# Patient Record
Sex: Male | Born: 1955 | Race: White | Hispanic: No | Marital: Married | Smoking: Current every day smoker
Health system: Southern US, Community
[De-identification: ages and names within clinical notes are randomized; demographics above are authoritative.]

## PROBLEM LIST (undated history)

## (undated) DIAGNOSIS — C61 Malignant neoplasm of prostate: Secondary | ICD-10-CM

## (undated) DIAGNOSIS — F1020 Alcohol dependence, uncomplicated: Secondary | ICD-10-CM

## (undated) HISTORY — PX: HEMORRHOID SURGERY: SHX153

## (undated) HISTORY — DX: Alcohol dependence, uncomplicated: F10.20

## (undated) HISTORY — DX: Malignant neoplasm of prostate: C61

---

## 2020-03-13 ENCOUNTER — Telehealth: Payer: Self-pay

## 2020-03-13 NOTE — Telephone Encounter (Signed)
Mrs Chait advised and scheduled on 03/18/20. Spoke with Mrs Littman for a while she mentions at that time that when patient has these episodes he gets flushed in his face and his b/p gets elevated between 150ish to 170ish on the top number. Patient would not let wife call 911 last night because he was starting to feel better. Patient was sleeping during our call. I advised Mrs Stebner that patient really needs to be seen at Urgent Care or ER for these symptoms and it was not safe to wait (discussed at length-per wife patient gets stubborn per wife "typical man") She will keep an eye on him today and if he starts having another episode again  and his b/p becomes elevated again she will call 911. She is aware not to wait till Monday if issues develop again. Appointment kept for re establishing care on Monday.  Patient is self pay. FYI to Dr Silvio Pate about above

## 2020-03-13 NOTE — Telephone Encounter (Signed)
I agree with the emergency recommendations. No way to tell if something serious might be going on at this point He should be Medicare eligible at this point ---hopefully that is being set up

## 2020-03-13 NOTE — Telephone Encounter (Signed)
Miguel Christian (patinet's wife who sees Dr Silvio Pate and has been seen him for years) called asking if Dr Silvio Pate would see patient. He use to see Dr Silvio Pate long time ago ( it was so long that there is nothing in Epic) and has not been seen by any other PCP. The reason is patient has been having aggitation/anxiety issues off and on, had episode last night, gets aggitated, paces, feels like he can not catch his breath and has to take deep breaths to calm himself. They have had some family loose and some changes in their life that could have been triggering this per wife.  I advised Miguel Christian that we are not taking any new patients right now and she said that she has seen Dr Silvio Pate for a long time and can not imagine that he would not see her husband. I advised that I would send a message to get Dr Alla German feedback and the nurse would call her back to let them know his recommendations. CB: [phone] and Cell # [phone]. Thank you

## 2020-03-13 NOTE — Telephone Encounter (Signed)
Okay to set up appt for him---hopefully you can find a spot by next week or so. I really can't make any recommendations till seeing him

## 2020-03-15 ENCOUNTER — Other Ambulatory Visit: Payer: Self-pay

## 2020-03-18 ENCOUNTER — Encounter: Payer: Self-pay | Admitting: Internal Medicine

## 2020-03-18 ENCOUNTER — Ambulatory Visit (INDEPENDENT_AMBULATORY_CARE_PROVIDER_SITE_OTHER): Payer: Medicare Other | Admitting: Internal Medicine

## 2020-03-18 ENCOUNTER — Other Ambulatory Visit: Payer: Self-pay

## 2020-03-18 VITALS — BP 124/80 | HR 82 | Temp 98.0°F | Ht 70.5 in | Wt 216.0 lb

## 2020-03-18 DIAGNOSIS — F1020 Alcohol dependence, uncomplicated: Secondary | ICD-10-CM | POA: Diagnosis not present

## 2020-03-18 DIAGNOSIS — R002 Palpitations: Secondary | ICD-10-CM | POA: Diagnosis not present

## 2020-03-18 DIAGNOSIS — Z23 Encounter for immunization: Secondary | ICD-10-CM | POA: Diagnosis not present

## 2020-03-18 NOTE — Addendum Note (Signed)
Addended by: Pilar Grammes on: 03/18/2020 05:27 PM   Modules accepted: Orders

## 2020-03-18 NOTE — Progress Notes (Signed)
Subjective:    Patient ID: Miguel Christian, male    DOB: [birth year], 65 y.o.   MRN: [id number]  HPI Here with wife to reestablish care---hasn't been seen in quite some time (not seeing any doctors) Concern about agitation and BP This visit occurred during the SARS-CoV-2 public health emergency.  Safety protocols were in place, including screening questions prior to the visit, additional usage of staff PPE, and extensive cleaning of exam room while observing appropriate contact time as indicated for disinfecting solutions.   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 Some SOB---but it passed fairly quickly Then this recurred about 5 weeks ago Had to pace around--had to take deep breaths and was antsy  Then wife went to [place] 3 weeks ago to see family He started getting the feeling again Felt flushed in the face and could see he was red (and could feel heart beating in his face) Resolved over about an hour Then it came back for a while Better the next morning  He has "nervous energy" Has to pace around the house No recurrence of facial flushing but can feel his heart beating in his throat  When he is out doing stuff---like out in the yard cutting wood--he does fine Just sitting around, he will get the heart beating feeling again Did do COVID test--just in case----negative Cousin checked BP at times. BP as high as 169/91----but also 133/86  No chest pain No arm pain No nausea  Did call ambulance EKG was normal Vitals were also fine--so they didn't transport No history of anxiety  Drinks at least 10-15 beers a day---and gets shakes at times (in mornings) Does drink in the day if he is not working Ongoing smoking  No current outpatient medications on file prior to visit.   No current facility-administered medications on file prior to visit.    No Known Allergies  Past Medical History:  Diagnosis Date  . Alcohol use disorder,  moderate, dependence ([place])     Past Surgical History:  Procedure Laterality Date  . HEMORRHOID SURGERY  ~2000    Family History  Problem Relation Age of Onset  . Alcohol abuse Father   . Alcohol abuse Brother   . Atrial fibrillation Brother   . Colon cancer Maternal Uncle        2 of them  . Hearing loss Maternal Uncle        2 of uncles  . Diabetes Brother   . Hypertension Brother     Social History   Socioeconomic History  . Marital status: Married    Spouse name: Not on file  . Number of children: Not on file  . Years of education: Not on file  . Highest education level: Not on file  Occupational History  . Occupation: Engineer, drilling    Comment: Retired  . Occupation: SIde work    Comment: Scientist, water quality work, Radiographer, therapeutic  . Smoking status: Current Every Day Smoker  . Smokeless tobacco: Never Used  Substance and Sexual Activity  . Alcohol use: Not on file  . Drug use: Not on file  . Sexual activity: Not on file  Other Topics Concern  . Not on file  Social History Narrative   2 step sons---- 1 died 24-May-2010 and they other is estranged   Raised 2 granddaughters   Social Determinants of Radio broadcast assistant Strain: Not on file  Food Insecurity: Not on file  Transportation Needs:  Not on file  Physical Activity: Not on file  Stress: Not on file  Social Connections: Not on file  Intimate Partner Violence: Not on file   Review of Systems  Constitutional: Negative for fatigue and unexpected weight change.  Respiratory: Positive for shortness of breath. Negative for cough and chest tightness.   Cardiovascular: Positive for palpitations. Negative for chest pain and leg swelling.  Gastrointestinal: Negative for blood in stool and constipation.       No sig heartburn  Genitourinary: Negative for difficulty urinating and urgency.       Nocturia x 3-4  Musculoskeletal: Positive for arthralgias and myalgias.       Takes ibuprofen 600mg  once (and sometimes  twice a day)  Neurological: Negative for dizziness, syncope, light-headedness and headaches.  Psychiatric/Behavioral: Negative for dysphoric mood and sleep disturbance. The patient is not nervous/anxious.        Objective:   Physical Exam Constitutional:      Appearance: Normal appearance.  HENT:     Mouth/Throat:     Comments: No oral lesions Cardiovascular:     Rate and Rhythm: Normal rate and regular rhythm.     Heart sounds: No murmur heard. No gallop.      Comments: Normal pulse on right, very faint on left Pulmonary:     Effort: Pulmonary effort is normal.     Breath sounds: Normal breath sounds. No wheezing or rales.  Abdominal:     Palpations: Abdomen is soft.     Tenderness: There is no abdominal tenderness.  Musculoskeletal:     Cervical back: Neck supple.     Right lower leg: No edema.     Left lower leg: No edema.  Lymphadenopathy:     Cervical: No cervical adenopathy.  Neurological:     General: No focal deficit present.     Mental Status: He is alert and oriented to person, place, and time.  Psychiatric:        Mood and Affect: Mood normal.        Behavior: Behavior normal.            Assessment & Plan:

## 2020-03-18 NOTE — Assessment & Plan Note (Signed)
Only beer but enough he sometimes has shakes the next morning (though doesn't drink all day if working and doesn't worsen) Discussed that he needs to at least cut back (suggested trial of just 3-4 beers a day for now)

## 2020-03-18 NOTE — Assessment & Plan Note (Signed)
Sensation is not of racing--but aware of his heart in spells Not with exertion No history of anxiety Discussed the alcohol can be a factor Must stop smoking Will check labs Cardiology evaluation

## 2020-03-19 LAB — COMPREHENSIVE METABOLIC PANEL
ALT: 28 U/L (ref 0–53)
AST: 21 U/L (ref 0–37)
Albumin: 4.3 g/dL (ref 3.5–5.2)
Alkaline Phosphatase: 61 U/L (ref 39–117)
BUN: 9 mg/dL (ref 6–23)
CO2: 29 mEq/L (ref 19–32)
Calcium: 9.8 mg/dL (ref 8.4–10.5)
Chloride: 101 mEq/L (ref 96–112)
Creatinine, Ser: 0.93 mg/dL (ref 0.40–1.50)
GFR: 86.21 mL/min (ref >60.00)
Glucose, Bld: 88 mg/dL (ref 70–99)
Potassium: 4.6 mEq/L (ref 3.5–5.1)
Sodium: 137 mEq/L (ref 135–145)
Total Bilirubin: 0.6 mg/dL (ref 0.2–1.2)
Total Protein: 6.8 g/dL (ref 6.0–8.3)

## 2020-03-19 LAB — CBC
HCT: 44.3 % (ref 39.0–52.0)
Hemoglobin: 15.4 g/dL (ref 13.0–17.0)
MCHC: 34.9 g/dL (ref 30.0–36.0)
MCV: 99.8 fl (ref 78.0–100.0)
Platelets: 232 10*3/uL (ref 150.0–400.0)
RBC: 4.43 Mil/uL (ref 4.22–5.81)
RDW: 13.3 % (ref 11.5–15.5)
WBC: 9.4 10*3/uL (ref 4.0–10.5)

## 2020-03-19 LAB — VITAMIN B12: Vitamin B-12: 141 pg/mL — ABNORMAL LOW (ref 211–911)

## 2020-03-19 LAB — T4, FREE: Free T4: 0.94 ng/dL (ref 0.60–1.60)

## 2020-03-19 LAB — SEDIMENTATION RATE: Sed Rate: 7 mm/hr (ref 0–20)

## 2020-03-22 ENCOUNTER — Ambulatory Visit (INDEPENDENT_AMBULATORY_CARE_PROVIDER_SITE_OTHER): Payer: Medicare Other

## 2020-03-22 ENCOUNTER — Other Ambulatory Visit: Payer: Self-pay

## 2020-03-22 ENCOUNTER — Ambulatory Visit (INDEPENDENT_AMBULATORY_CARE_PROVIDER_SITE_OTHER): Payer: Medicare Other | Admitting: Cardiology

## 2020-03-22 ENCOUNTER — Encounter: Payer: Self-pay | Admitting: Cardiology

## 2020-03-22 VITALS — BP 138/82 | HR 74 | Ht 70.5 in | Wt 220.0 lb

## 2020-03-22 DIAGNOSIS — F1020 Alcohol dependence, uncomplicated: Secondary | ICD-10-CM

## 2020-03-22 DIAGNOSIS — R002 Palpitations: Secondary | ICD-10-CM | POA: Diagnosis not present

## 2020-03-22 DIAGNOSIS — R0602 Shortness of breath: Secondary | ICD-10-CM | POA: Diagnosis not present

## 2020-03-22 NOTE — Patient Instructions (Signed)
Medication Instructions:  Your physician recommends that you continue on your current medications as directed. Please refer to the Current Medication list given to you today.  *If you need a refill on your cardiac medications before your next appointment, please call your pharmacy*   Lab Work: None ordered If you have labs (blood work) drawn today and your tests are completely normal, you will receive your results only by: Marland Kitchen MyChart Message (if you have MyChart) OR . A paper copy in the mail If you have any lab test that is abnormal or we need to change your treatment, we will call you to review the results.   Testing/Procedures: Your physician has requested that you have an echocardiogram. Echocardiography is a painless test that uses sound waves to create images of your heart. It provides your doctor with information about the size and shape of your heart and how well your heart's chambers and valves are working. This procedure takes approximately one hour. There are no restrictions for this procedure.  Your physician has recommended that you wear a Zio monitor.(To be worn for 14 days) This monitor is a medical device that records the heart's electrical activity. Doctors most often use these monitors to diagnose arrhythmias. Arrhythmias are problems with the speed or rhythm of the heartbeat. The monitor is a small device applied to your chest. You can wear one while you do your normal daily activities. While wearing this monitor if you have any symptoms to push the button and record what you felt. Once you have worn this monitor for the period of time provider prescribed (Usually 14 days), you will return the monitor device in the postage paid box. Once it is returned they will download the data collected and provide [place] with a report which the provider will then review and we will call you with those results. Important tips:  1. Avoid showering during the first 24 hours of wearing the  monitor. 2. Avoid excessive sweating to help maximize wear time. 3. Do not submerge the device, no hot tubs, and no swimming pools. 4. Keep any lotions or oils away from the patch. 5. After 24 hours you may shower with the patch on. Take brief showers with your back facing the shower head.  6. Do not remove patch once it has been placed because that will interrupt data and decrease adhesive wear time. 7. Push the button when you have any symptoms and write down what you were feeling. 8. Once you have completed wearing your monitor, remove and place into box which has postage paid and place in your outgoing mailbox.  9. If for some reason you have misplaced your box then call our office and we can provide another box and/or mail it off for you.         Follow-Up: At [facility], you and your health needs are our priority.  As part of our continuing mission to provide you with exceptional heart care, we have created designated Provider Care Teams.  These Care Teams include your primary Cardiologist (physician) and Advanced Practice Providers (APPs -  Physician Assistants and Nurse Practitioners) who all work together to provide you with the care you need, when you need it.  We recommend signing up for the patient portal called "MyChart".  Sign up information is provided on this After Visit Summary.  MyChart is used to connect with patients for Virtual Visits (Telemedicine).  Patients are able to view lab/test results, encounter notes, upcoming appointments, etc.  Non-urgent messages can be sent to your provider as well.   To learn more about what you can do with MyChart, go to [email or web address].    Your next appointment:   8 week(s)  The format for your next appointment:   In Person  Provider:   Lars Mage, MD   Other Instructions N/A

## 2020-03-22 NOTE — Progress Notes (Signed)
Electrophysiology Office Note:    Date:  03/22/2020   ID:  Miguel Christian, DOB Nov 07, 1955, MRN [id number]  PCP:  Venia Carbon, MD  [place] Cardiologist:  No primary care provider on file.  [facility] Electrophysiologist:  Vickie Epley, MD   Referring MD: Venia Carbon, MD   Chief Complaint: Palpitations  History of Present Illness:    Miguel Christian is a 65 y.o. male who presents for an evaluation of palpitations at the request of Dr. Silvio Pate. Their medical history includes alcohol abuse.  He drinks 10-15 beers per day.  He last saw Dr. Silvio Pate March 18, 2020.  At that appointment he reported feeling palpitations and feeling his heart race in his throat.  He is also felt jittery.  Today the patient is with his wife.  He tells me he has stopped smoking since beginning of 2022/05/05.  He is using a patch.  He describes the episodes to me during today's appointment.  He says that he would feel an abnormally prominent heartbeat in his neck and face.  Sometimes accompanied by a warmth in his face.  He would have the urge to take a deep breath during these episodes.  He does not describe a rapid heartbeat during most of the episodes.  He also tells me that sometimes recently he has been laying down at night and feels an irregular rhythm in his ear.  He has a strong family history of atrial fibrillation in his brother who has had 2 ablations and then other family members as well.  He is cut back on his drinking since he met with his primary care physician, Dr. Silvio Pate, recently.  He previously drank about 15 beers a day but is now drinking significantly less.  He tells me he is not quite down to the 2-3 beers per day recommended by Dr. Silvio Pate.  No syncope or presyncope.  No chest pain.  He does tell me that sometimes when he is working outside chopping wood the tips of his fingers will blanch and get very cold.  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  Past Medical History:   Diagnosis Date  . Alcohol use disorder, moderate, dependence ([place])     Past Surgical History:  Procedure Laterality Date  . HEMORRHOID SURGERY  ~2000    Current Medications: No outpatient medications have been marked as taking for the 03/22/20 encounter (Office Visit) with Vickie Epley, MD.     Allergies:   Patient has no known allergies.   Social History   Socioeconomic History  . Marital status: Married    Spouse name: Not on file  . Number of children: Not on file  . Years of education: Not on file  . Highest education level: Not on file  Occupational History  . Occupation: Engineer, drilling    Comment: Retired  . Occupation: SIde work    Comment: Scientist, water quality work, Radiographer, therapeutic  . Smoking status: Former Smoker    Quit date: 03/19/2020  . Smokeless tobacco: Never Used  . Tobacco comment: Using the nicotine patch  Substance and Sexual Activity  . Alcohol use: Not on file  . Drug use: Not on file  . Sexual activity: Not on file  Other Topics Concern  . Not on file  Social History Narrative   2 step sons---- 1 died 05/05/10 and they other is estranged   Raised 2 granddaughters   Social Determinants of Health   Financial Resource Strain:  Not on file  Food Insecurity: Not on file  Transportation Needs: Not on file  Physical Activity: Not on file  Stress: Not on file  Social Connections: Not on file     Family History: The patient's family history includes Alcohol abuse in his brother and father; Atrial fibrillation in his brother; Colon cancer in his maternal uncle; Diabetes in his brother; Hearing loss in his maternal uncle; Hypertension in his brother.  ROS:   Please see the history of present illness.    All other systems reviewed and are negative.  EKGs/Labs/Other Studies Reviewed:    The following studies were reviewed today:   EKG:  The ekg ordered today demonstrates sinus rhythm.  No Q waves.  Good precordial transition.  Recent Labs: 03/18/2020:  ALT 28; BUN 9; Creatinine, Ser 0.93; Hemoglobin 15.4; Platelets 232.0; Potassium 4.6; Sodium 137  Recent Lipid Panel No results found for: CHOL, TRIG, HDL, CHOLHDL, VLDL, LDLCALC, LDLDIRECT  Physical Exam:    VS:  BP 138/82   Pulse 74   Ht 5' 10.5" (1.791 m)   Wt 220 lb (99.8 kg)   BMI 31.12 kg/m     Wt Readings from Last 3 Encounters:  03/22/20 220 lb (99.8 kg)  03/18/20 216 lb (98 kg)     GEN:  Well nourished, well developed in no acute distress HEENT: Normal NECK: No JVD; No carotid bruits LYMPHATICS: No lymphadenopathy CARDIAC: RRR, no murmurs, rubs, gallops RESPIRATORY:  Clear to auscultation without rales, wheezing or rhonchi  ABDOMEN: Soft, non-tender, non-distended MUSCULOSKELETAL:  No edema; No deformity  SKIN: Warm and dry NEUROLOGIC:  Alert and oriented x 3 PSYCHIATRIC:  Normal affect   ASSESSMENT:    1. Palpitations   2. Alcohol use disorder, moderate, dependence ([place])   3. SOB (shortness of breath)    PLAN:    In order of problems listed above:  1. Palpitations Patient has some strong heartbeats that he feels in his neck and face accompanied by mild dyspnea.  Unclear what the etiology would be.  Possibly paroxysms of atrial fibrillation versus PVCs.  Also could be noncardiac.  We will start with a 2-week ZIO monitor and plan to see him back in clinic in 8 weeks.   2.  Alcohol abuse He is cutting back.  Working with Dr. Silvio Pate on this.   3.  Shortness of breath Associated with his palpitations.  Plan to get an echocardiogram to further assess.  Follow-up 8 weeks.  Medication Adjustments/Labs and Tests Ordered: Current medicines are reviewed at length with the patient today.  Concerns regarding medicines are outlined above.  Orders Placed This Encounter  Procedures  . LONG TERM MONITOR (3-14 DAYS)  . EKG 12-Lead  . ECHOCARDIOGRAM COMPLETE   No orders of the defined types were placed in this encounter.    Signed, Lars Mage, MD, [facility]   03/22/2020 3:32 PM    Electrophysiology [place]

## 2020-04-05 DIAGNOSIS — R002 Palpitations: Secondary | ICD-10-CM | POA: Diagnosis not present

## 2020-04-09 ENCOUNTER — Telehealth: Payer: Self-pay | Admitting: Internal Medicine

## 2020-04-09 ENCOUNTER — Other Ambulatory Visit: Payer: Medicare Other

## 2020-04-09 NOTE — Telephone Encounter (Signed)
Patients wife called to cancel his upcoming appt. They are on the way to [place] cause Karen's brother had a heart attack and passed away. They won't be back till after the 4th from [place] . They just wanted me to let you know. EM

## 2020-04-10 NOTE — Telephone Encounter (Signed)
Noted Please reschedule his follow up for a month or so

## 2020-04-19 ENCOUNTER — Ambulatory Visit: Payer: Medicare Other | Admitting: Internal Medicine

## 2020-04-25 ENCOUNTER — Ambulatory Visit (INDEPENDENT_AMBULATORY_CARE_PROVIDER_SITE_OTHER): Payer: Medicare Other

## 2020-04-25 ENCOUNTER — Other Ambulatory Visit: Payer: Self-pay

## 2020-04-25 DIAGNOSIS — R0602 Shortness of breath: Secondary | ICD-10-CM | POA: Diagnosis not present

## 2020-04-25 LAB — ECHOCARDIOGRAM COMPLETE
AR max vel: 2.64 cm2
AV Area VTI: 2.45 cm2
AV Area mean vel: 2.49 cm2
AV Mean grad: 8 mmHg
AV Peak grad: 15.7 mmHg
Ao pk vel: 1.98 m/s
Area-P 1/2: 2.99 cm2
Calc EF: 56 %
S' Lateral: 4 cm
Single Plane A2C EF: 56.1 %
Single Plane A4C EF: 55.3 %

## 2020-04-29 ENCOUNTER — Other Ambulatory Visit: Payer: Self-pay

## 2020-04-29 ENCOUNTER — Telehealth: Payer: Self-pay

## 2020-04-29 ENCOUNTER — Ambulatory Visit (INDEPENDENT_AMBULATORY_CARE_PROVIDER_SITE_OTHER): Payer: Medicare Other | Admitting: Internal Medicine

## 2020-04-29 ENCOUNTER — Encounter: Payer: Self-pay | Admitting: Internal Medicine

## 2020-04-29 VITALS — BP 118/72 | HR 74 | Temp 97.6°F | Ht 71.0 in | Wt 214.0 lb

## 2020-04-29 DIAGNOSIS — F1020 Alcohol dependence, uncomplicated: Secondary | ICD-10-CM | POA: Diagnosis not present

## 2020-04-29 DIAGNOSIS — I471 Supraventricular tachycardia, unspecified: Secondary | ICD-10-CM | POA: Insufficient documentation

## 2020-04-29 DIAGNOSIS — E538 Deficiency of other specified B group vitamins: Secondary | ICD-10-CM

## 2020-04-29 MED ORDER — DILTIAZEM HCL ER COATED BEADS 180 MG PO CP24: 180.0000 mg | ORAL_CAPSULE | Freq: Every day | ORAL | 3 refills | Status: DC

## 2020-04-29 MED ORDER — DILTIAZEM HCL ER COATED BEADS 120 MG PO TB24: 120.0000 mg | ORAL_TABLET | Freq: Every day | ORAL | 3 refills | Status: DC

## 2020-04-29 NOTE — Assessment & Plan Note (Signed)
He has cut back but not stopped No eye opener or morning symptoms Only beer Counseled about minimizing use

## 2020-04-29 NOTE — Telephone Encounter (Signed)
Santiago Glad (DPR signed) left v/m that saw Dr Silvio Pate this afternoon; pharmacy advised pts wife that Diltiazem 120 mg is not in stock and usually [facility] low dose for diltiazem is 180 mg and the 180 mg is 1/4 of price of the diltiazem 120mg /. Santiago Glad request cb after reviewed by DR Silvio Pate. Sending note to Dr Silvio Pate and Larene Beach CMA. Also wants to know if pt should get covid booster.

## 2020-04-29 NOTE — Telephone Encounter (Signed)
Let them know I sent the 180mg  instead. This is still not a big dose--but have him let me know if he has any dizziness, etc  Yes, he should get the booster

## 2020-04-29 NOTE — Assessment & Plan Note (Signed)
Level was low He feels some better since starting 5000 under his tongue Will recheck

## 2020-04-29 NOTE — Telephone Encounter (Signed)
Spoke to wife, Santiago Glad, per DPR.

## 2020-04-29 NOTE — Progress Notes (Signed)
Subjective:    Patient ID: Miguel Christian, male    DOB: [birth year], 65 y.o.   MRN: [id number]  HPI Here with wife for follow up of palpitations This visit occurred during the SARS-CoV-2 public health emergency.  Safety protocols were in place, including screening questions prior to the visit, additional usage of staff PPE, and extensive cleaning of exam room while observing appropriate contact time as indicated for disinfecting solutions.   Still has palpitations Challenging time---granddaughter tried suicide, brother in law (77) died in [place]--they needed to go there, etc  Has stopped smoking Still drinking--but he has cut back  No chest pain  No SOB  Current Outpatient Medications on File Prior to Visit  Medication Sig Dispense Refill  . Cyanocobalamin (VITAMIN B-12) 5000 MCG SUBL Place under the tongue.     No current facility-administered medications on file prior to visit.    No Known Allergies  Past Medical History:  Diagnosis Date  . Alcohol use disorder, moderate, dependence ([place])     Past Surgical History:  Procedure Laterality Date  . HEMORRHOID SURGERY  ~2000    Family History  Problem Relation Age of Onset  . Alcohol abuse Father   . Alcohol abuse Brother   . Atrial fibrillation Brother   . Colon cancer Maternal Uncle        2 of them  . Hearing loss Maternal Uncle        2 of uncles  . Diabetes Brother   . Hypertension Brother     Social History   Socioeconomic History  . Marital status: Married    Spouse name: Not on file  . Number of children: Not on file  . Years of education: Not on file  . Highest education level: Not on file  Occupational History  . Occupation: Engineer, drilling    Comment: Retired  . Occupation: SIde work    Comment: Scientist, water quality work, Radiographer, therapeutic  . Smoking status: Former Smoker    Quit date: 03/19/2020    Years since quitting: 0.1  . Smokeless tobacco: Never Used  . Tobacco comment: Using the nicotine patch   Substance and Sexual Activity  . Alcohol use: Not on file  . Drug use: Not on file  . Sexual activity: Not on file  Other Topics Concern  . Not on file  Social History Narrative   2 step sons---- 1 died 05-13-10 and they other is estranged   Raised 2 granddaughters   Social Determinants of Radio broadcast assistant Strain: Not on file  Food Insecurity: Not on file  Transportation Needs: Not on file  Physical Activity: Not on file  Stress: Not on file  Social Connections: Not on file  Intimate Partner Violence: Not on file   Review of Systems Close to dizziness at times Awakens with right hand numbness (he thinks) --discussed trying splint Eating is spotty---doesn't want to eat when he feels "antsy"    Objective:   Physical Exam Constitutional:      Appearance: Normal appearance.  Cardiovascular:     Rate and Rhythm: Normal rate and regular rhythm.     Heart sounds: No murmur heard. No gallop.   Pulmonary:     Effort: Pulmonary effort is normal.     Breath sounds: Normal breath sounds. No wheezing or rales.  Musculoskeletal:     Cervical back: Neck supple.     Right lower leg: No edema.     Left lower leg: No edema.  Lymphadenopathy:     Cervical: No cervical adenopathy.  Neurological:     Mental Status: He is alert.            Assessment & Plan:

## 2020-04-29 NOTE — Assessment & Plan Note (Signed)
Zio monitor showed a 5 beat VT and many SVT spells (longest 18 seconds) He still has symptoms Will start diltiazem 120mg  Discussed possible side effects

## 2020-04-30 LAB — VITAMIN B12: Vitamin B-12: >1506 pg/mL — ABNORMAL HIGH (ref 211–911)

## 2020-05-02 ENCOUNTER — Telehealth: Payer: Self-pay

## 2020-05-02 NOTE — Telephone Encounter (Signed)
Outreach made to Pt.  Spoke with his wife.  Pt has started diltiazem.    He will continue over the weekend and send this nurse a mychart message on Monday to advise if any improvement on the new medication.  Wife thanked Marine scientist for call.

## 2020-05-02 NOTE — Telephone Encounter (Signed)
-----   Message from Vickie Epley, MD sent at 05/02/2020  3:15 PM EDT ----- Serita Sheller monitor results show brief salvos (all < 20 seconds) of likely an AT. He had a few during the monitoring period that did not correspond to patient triggered events for symptoms. I think diltiazem is a very reasonable choice. I have cc'ed Sonia Baller here to give him a call to discuss and see if he has any questions. I am scheduled to see him in 2 weeks. Thanks! Lars Mage ----- Message ----- From: Venia Carbon, MD Sent: 04/29/2020   2:39 PM EDT To: Vickie Epley, MD  He never heard officially about his zio monitor. He is still having palpitations. I started low dose diltiazem--please let him and me know if you think something else is better

## 2020-05-22 ENCOUNTER — Encounter: Payer: Self-pay | Admitting: Cardiology

## 2020-05-22 ENCOUNTER — Ambulatory Visit (INDEPENDENT_AMBULATORY_CARE_PROVIDER_SITE_OTHER): Payer: Medicare Other | Admitting: Cardiology

## 2020-05-22 ENCOUNTER — Other Ambulatory Visit: Payer: Self-pay

## 2020-05-22 VITALS — BP 120/60 | HR 63 | Ht 72.0 in | Wt 221.0 lb

## 2020-05-22 DIAGNOSIS — R002 Palpitations: Secondary | ICD-10-CM | POA: Diagnosis not present

## 2020-05-22 MED ORDER — DILTIAZEM HCL ER COATED BEADS 240 MG PO CP24: 240.0000 mg | ORAL_CAPSULE | Freq: Every day | ORAL | 3 refills | Status: AC

## 2020-05-22 NOTE — Patient Instructions (Signed)
Medication Instructions:   Your physician has recommended you make the following change in your medication:   1. INCREASE Diltiazem 240mg  - Take one tablet by mouth daily.   *If you need a refill on your cardiac medications before your next appointment, please call your pharmacy*   Lab Work:  1. None Ordered  If you have labs (blood work) drawn today and your tests are completely normal, you will receive your results only by: Marland Kitchen MyChart Message (if you have MyChart) OR . A paper copy in the mail If you have any lab test that is abnormal or we need to change your treatment, we will call you to review the results.   Testing/Procedures:  1. None Ordered   Follow-Up: At [facility], you and your health needs are our priority.  As part of our continuing mission to provide you with exceptional heart care, we have created designated Provider Care Teams.  These Care Teams include your primary Cardiologist (physician) and Advanced Practice Providers (APPs -  Physician Assistants and Nurse Practitioners) who all work together to provide you with the care you need, when you need it.  We recommend signing up for the patient portal called "MyChart".  Sign up information is provided on this After Visit Summary.  MyChart is used to connect with patients for Virtual Visits (Telemedicine).  Patients are able to view lab/test results, encounter notes, upcoming appointments, etc.  Non-urgent messages can be sent to your provider as well.   To learn more about what you can do with MyChart, go to [email or web address].    Your next appointment:   6 month(s)  The format for your next appointment:   In Person  Provider:   Lars Mage, MD

## 2020-05-22 NOTE — Progress Notes (Signed)
Electrophysiology Office Follow up Visit Note:    Date:  05/22/2020   ID:  Miguel Christian, DOB 1955-08-22, MRN [id number]  PCP:  Venia Carbon, MD  [place] Cardiologist:  No primary care provider on file.  [facility] Electrophysiologist:  Vickie Epley, MD    Interval History:    Miguel Christian is a 65 y.o. male who presents for a follow up visit. They were last seen in clinic March 22, 2020 for palpitations.   A 2-week ZIO monitor was ordered. Since their last appointment, the patient saw Dr. Silvio Pate who started a calcium channel blocker.  He is with his wife in clinic today.   Past Medical History:  Diagnosis Date  . Alcohol use disorder, moderate, dependence ([place])     Past Surgical History:  Procedure Laterality Date  . HEMORRHOID SURGERY  ~2000    Current Medications: Current Meds  Medication Sig  . Cyanocobalamin (VITAMIN B-12) 5000 MCG SUBL Place under the tongue 2 (two) times a week.  . diltiazem (CARDIZEM CD) 180 MG 24 hr capsule Take 1 capsule (180 mg total) by mouth daily.  . Multiple Vitamins-Minerals (MULTIVITAMIN ADULT EXTRA C PO) Take by mouth.     Allergies:   Patient has no known allergies.   Social History   Socioeconomic History  . Marital status: Married    Spouse name: Not on file  . Number of children: Not on file  . Years of education: Not on file  . Highest education level: Not on file  Occupational History  . Occupation: Engineer, drilling    Comment: Retired  . Occupation: SIde work    Comment: Scientist, water quality work, Radiographer, therapeutic  . Smoking status: Former Smoker    Quit date: 03/19/2020    Years since quitting: 0.1  . Smokeless tobacco: Never Used  . Tobacco comment: Using the nicotine patch  Substance and Sexual Activity  . Alcohol use: Not on file  . Drug use: Not on file  . Sexual activity: Not on file  Other Topics Concern  . Not on file  Social History Narrative   2 step sons---- 1 died 05/10/10 and they other is  estranged   Raised 2 granddaughters   Social Determinants of Radio broadcast assistant Strain: Not on file  Food Insecurity: Not on file  Transportation Needs: Not on file  Physical Activity: Not on file  Stress: Not on file  Social Connections: Not on file     Family History: The patient's family history includes Alcohol abuse in his brother and father; Atrial fibrillation in his brother; Colon cancer in his maternal uncle; Diabetes in his brother; Hearing loss in his maternal uncle; Hypertension in his brother.  ROS:   Please see the history of present illness.    All other systems reviewed and are negative.  EKGs/Labs/Other Studies Reviewed:    The following studies were reviewed today:  April 12, 2020 ZIO personally reviewed HR 52-207, average 78 bpm. 36 episodes of SVT, longest lasting 18 seconds at a rate of 110bpm. Fastest episode lasted 13 beats at a rate of 207 bpm. Rare ventricular ectopy.  April 25, 2020 echo personally reviewed Left ventricular function normal, 60% Right ventricular function normal No significant valvular abnormalities  EKG:  The ekg ordered today demonstrates normal sinus rhythm.  Normal EKG.  Recent Labs: 03/18/2020: ALT 28; BUN 9; Creatinine, Ser 0.93; Hemoglobin 15.4; Platelets 232.0; Potassium 4.6; Sodium 137  Recent Lipid Panel No results  found for: CHOL, TRIG, HDL, CHOLHDL, VLDL, LDLCALC, LDLDIRECT  Physical Exam:    VS:  BP 120/60 (BP Location: Left Arm, Patient Position: Sitting, Cuff Size: Normal)   Pulse 63   Ht 6' (1.829 m)   Wt 221 lb (100.2 kg)   SpO2 98%   BMI 29.97 kg/m     Wt Readings from Last 3 Encounters:  05/22/20 221 lb (100.2 kg)  04/29/20 214 lb (97.1 kg)  03/22/20 220 lb (99.8 kg)     GEN:  Well nourished, well developed in no acute distress HEENT: Normal NECK: No JVD; No carotid bruits LYMPHATICS: No lymphadenopathy CARDIAC: RRR, no murmurs, rubs, gallops RESPIRATORY:  Clear to auscultation  without rales, wheezing or rhonchi  ABDOMEN: Soft, non-tender, non-distended MUSCULOSKELETAL:  No edema; No deformity  SKIN: Warm and dry NEUROLOGIC:  Alert and oriented x 3 PSYCHIATRIC:  Normal affect   ASSESSMENT:    1. Palpitations    PLAN:    In order of problems listed above:  1. Palpitations Symptomatic PACs, PVCs.  Doing better on diltiazem 180 mg once daily.  Still having some breakthrough symptoms.  There is certainly an element of anxiety and stress that are contributing. We will plan to increase his Cardizem to 240 mg by mouth once daily.  I will see him back in 6 months to assess response and how he is doing.  Medication Adjustments/Labs and Tests Ordered: Current medicines are reviewed at length with the patient today.  Concerns regarding medicines are outlined above.  No orders of the defined types were placed in this encounter.  No orders of the defined types were placed in this encounter.    Signed, Lars Mage, MD, [facility], [facility]  05/22/2020 2:30 PM    Electrophysiology [facility]

## 2020-08-07 ENCOUNTER — Ambulatory Visit (INDEPENDENT_AMBULATORY_CARE_PROVIDER_SITE_OTHER): Payer: Medicare Other | Admitting: Internal Medicine

## 2020-08-07 ENCOUNTER — Other Ambulatory Visit: Payer: Self-pay

## 2020-08-07 ENCOUNTER — Encounter: Payer: Self-pay | Admitting: Internal Medicine

## 2020-08-07 VITALS — BP 132/82 | HR 67 | Temp 97.6°F | Ht 70.75 in | Wt 225.0 lb

## 2020-08-07 DIAGNOSIS — E538 Deficiency of other specified B group vitamins: Secondary | ICD-10-CM | POA: Diagnosis not present

## 2020-08-07 DIAGNOSIS — Z1211 Encounter for screening for malignant neoplasm of colon: Secondary | ICD-10-CM | POA: Diagnosis not present

## 2020-08-07 DIAGNOSIS — Z0000 Encounter for general adult medical examination without abnormal findings: Secondary | ICD-10-CM | POA: Insufficient documentation

## 2020-08-07 DIAGNOSIS — F1020 Alcohol dependence, uncomplicated: Secondary | ICD-10-CM

## 2020-08-07 DIAGNOSIS — Z87891 Personal history of nicotine dependence: Secondary | ICD-10-CM

## 2020-08-07 DIAGNOSIS — Z7189 Other specified counseling: Secondary | ICD-10-CM

## 2020-08-07 DIAGNOSIS — I471 Supraventricular tachycardia: Secondary | ICD-10-CM | POA: Diagnosis not present

## 2020-08-07 NOTE — Progress Notes (Signed)
Hearing Screening  Method: Audiometry   500Hz  1000Hz  2000Hz  4000Hz   Right ear 20 20 20 20   Left ear 20 20 20 20    Vision Screening   Right eye Left eye Both eyes  Without correction 20/25 20/40 20/25   With correction

## 2020-08-07 NOTE — Assessment & Plan Note (Signed)
See social history 

## 2020-08-07 NOTE — Assessment & Plan Note (Signed)
Is taking the supplements intermittently-=--discussed

## 2020-08-07 NOTE — Assessment & Plan Note (Signed)
I have personally reviewed the Medicare Annual Wellness questionnaire and have noted 1. The patient's medical and social history 2. Their use of alcohol, tobacco or illicit drugs 3. Their current medications and supplements 4. The patient's functional ability including ADL's, fall risks, home safety risks and hearing or visual             impairment. 5. Diet and physical activities 6. Evidence for depression or mood disorders  The patients weight, height, BMI and visual acuity have been recorded in the chart I have made referrals, counseling and provided education to the patient based review of the above and I have provided the pt with a written personalized care plan for preventive services.  I have provided you with a copy of your personalized plan for preventive services. Please take the time to review along with your updated medication list.  Will need pneumovax later COVID booster and flu vaccine Will set up for colonoscopy Discussed PSA--will defer Discussed exercise Td if injury Doesn't want lung cancer screening for now Needs AAA scan

## 2020-08-07 NOTE — Progress Notes (Signed)
Subjective:    Patient ID: Miguel Christian, male    DOB: 1955/12/18, 65 y.o.   MRN: [id number]  HPI Here with wife for Welcome to Medicare visit and follow up of chronic health conditions This visit occurred during the SARS-CoV-2 public health emergency.  Safety protocols were in place, including screening questions prior to the visit, additional usage of staff PPE, and extensive cleaning of exam room while observing appropriate contact time as indicated for disinfecting solutions.   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     diltiazem (CARDIZEM CD) 240 MG 24 hr capsule Take 1 capsule (240 mg total) by mouth daily. 90 capsule 3   Multiple Vitamins-Minerals (MULTIVITAMIN ADULT EXTRA C PO) Take by mouth.     No current facility-administered medications on file prior to visit.    No Known Allergies  Past Medical History:  Diagnosis Date   Alcohol use disorder, moderate, dependence ([facility])     Past Surgical History:  Procedure Laterality Date   HEMORRHOID SURGERY  ~2000    Family History  Problem Relation Age of Onset   Alcohol abuse  Father    Alcohol abuse Brother    Atrial fibrillation Brother    Colon cancer Maternal Uncle        2 of them   Hearing loss Maternal Uncle        2 of uncles   Diabetes Brother    Hypertension Brother     Social History   Socioeconomic History   Marital status: Married    Spouse name: Not on file   Number of children: Not on file   Years of education: Not on file   Highest education level: Not on file  Occupational History   Occupation: Engineer, drilling    Comment: Retired   Occupation: SIde work    Comment: Landscaping  Tobacco Use   Smoking status: Former    Pack years: 0.00    Types: Cigarettes    Quit date: 03/19/2020    Years since quitting: 0.3   Smokeless tobacco: Never   Tobacco comments:    Using the nicotine patch  Substance and Sexual Activity   Alcohol use: Not on file   Drug use: Not on file   Sexual activity: Not on file  Other Topics Concern   Not on file  Social History Narrative   2 step sons---- 1 died May 21, 2010 and they other is estranged   Raised 2 granddaughters      No living will   Wife would be health care POA--granddaughter is alternate   Would accept resuscitation   Not sure about tube feeds   Social  Determinants of Health   Financial Resource Strain: Not on file  Food Insecurity: Not on file  Transportation Needs: Not on file  Physical Activity: Not on file  Stress: Not on file  Social Connections: Not on file  Intimate Partner Violence: Not on file   Review of Systems Appetite is okay--not big Weight stable Sleeps fine Wears seat belt Full dentures Has growth on chin--flakes and tender at times No heartburn or dysphagia No specific back or joint pains. Chronic left foot pain (broke it years ago)    Objective:   Physical Exam Constitutional:      Appearance: Normal appearance.  HENT:     Mouth/Throat:     Comments: No lesions Eyes:     Conjunctiva/sclera: Conjunctivae normal.     Pupils: Pupils are equal, round, and reactive  to light.  Cardiovascular:     Rate and Rhythm: Normal rate and regular rhythm.     Heart sounds: No murmur heard.   No gallop.     Comments: Normal pulse right foot, faint on left Pulmonary:     Effort: Pulmonary effort is normal.     Breath sounds: Normal breath sounds. No wheezing or rales.  Abdominal:     Palpations: Abdomen is soft.     Tenderness: There is no abdominal tenderness.  Musculoskeletal:     Cervical back: Neck supple.     Right lower leg: No edema.     Left lower leg: No edema.  Lymphadenopathy:     Cervical: No cervical adenopathy.  Skin:    Comments: Warty type growth on chin--~20mm. Recommended cryotherapapy 2 cysts on back  Neurological:     Mental Status: He is alert and oriented to person, place, and time.     Comments: President--- "Edmon Crape, Trump, Obama" [id number] Recall 3/3  Psychiatric:        Mood and Affect: Mood normal.        Behavior: Behavior normal.           Assessment & Plan:

## 2020-08-07 NOTE — Assessment & Plan Note (Signed)
Controlled with the diltiazem

## 2020-08-07 NOTE — Assessment & Plan Note (Signed)
Still drinks too much No clear worrisome behaviors Has some tremor--but not a withdrawal thing Counseled to reduce quantitiy

## 2020-08-16 ENCOUNTER — Telehealth: Payer: Self-pay

## 2020-08-16 NOTE — Telephone Encounter (Signed)
Left message for patient to call me back. Patient is scheduled for his Ultrasound ordered by Dr Damita Dunnings on 08/22/20 at 9 am at [facility]. To arrive at 8:30 am, nothing to eat or drink after midnight. Enter through the main [employer].  If need to reschedule please call [phone]-option [phone] then option 2.

## 2020-08-21 NOTE — Telephone Encounter (Signed)
Patient's wife advised

## 2020-08-22 ENCOUNTER — Other Ambulatory Visit: Payer: Self-pay

## 2020-08-22 ENCOUNTER — Ambulatory Visit
Admission: RE | Admit: 2020-08-22 | Discharge: 2020-08-22 | Disposition: A | Discharge status: Home / Self Care | Payer: Medicare Other | Source: Ambulatory Visit | Attending: Internal Medicine | Admitting: Internal Medicine

## 2020-08-22 DIAGNOSIS — Z87891 Personal history of nicotine dependence: Secondary | ICD-10-CM | POA: Diagnosis not present

## 2020-08-22 DIAGNOSIS — Z136 Encounter for screening for cardiovascular disorders: Secondary | ICD-10-CM | POA: Diagnosis not present

## 2020-09-05 ENCOUNTER — Telehealth (INDEPENDENT_AMBULATORY_CARE_PROVIDER_SITE_OTHER): Payer: Self-pay | Admitting: Gastroenterology

## 2020-09-05 DIAGNOSIS — Z1211 Encounter for screening for malignant neoplasm of colon: Secondary | ICD-10-CM

## 2020-09-05 MED ORDER — PEG 3350-KCL-NA BICARB-NACL 420 G PO SOLR: 4000.0000 mL | Freq: Once | ORAL | 0 refills | Status: AC

## 2020-09-05 NOTE — Progress Notes (Signed)
Gastroenterology Pre-Procedure Review  Request Date: 09/24/20 Requesting Physician: Dr. Bonna Gains  PATIENT REVIEW QUESTIONS: The patient responded to the following health history questions as indicated:    1. Are you having any GI issues? no 2. Do you have a personal history of Polyps? no 3. Do you have a family history of Colon Cancer or Polyps? yes (Uncle colon cancer; Mother colon polyps) 4. Diabetes Mellitus? no 5. Joint replacements in the past 12 months?no 6. Major health problems in the past 3 months?no 7. Any artificial heart valves, MVP, or defibrillator?no    MEDICATIONS & ALLERGIES:    Patient reports the following regarding taking any anticoagulation/antiplatelet therapy:   Plavix, Coumadin, Eliquis, Xarelto, Lovenox, Pradaxa, Brilinta, or Effient? no Aspirin? no  Patient confirms/reports the following medications:  Current Outpatient Medications  Medication Sig Dispense Refill   Cyanocobalamin (VITAMIN B-12) 5000 MCG SUBL Place under the tongue 2 (two) times a week.     diltiazem (CARDIZEM CD) 240 MG 24 hr capsule Take 240 mg by mouth daily.     Multiple Vitamins-Minerals (MULTIVITAMIN ADULT EXTRA C PO) Take by mouth.     No current facility-administered medications for this visit.    Patient confirms/reports the following allergies:  No Known Allergies  No orders of the defined types were placed in this encounter.   AUTHORIZATION INFORMATION Primary Insurance: 1D#: Group #:  Secondary Insurance: 1D#: Group #:  SCHEDULE INFORMATION: Date: 09/24/20 Time: Location: [place]

## 2020-09-23 NOTE — Anesthesia Preprocedure Evaluation (Addendum)
Anesthesia Evaluation  Patient identified by MRN, date of birth, ID band Patient awake    Reviewed: Allergy & Precautions, NPO status   History of Anesthesia Complications Negative for: history of anesthetic complications  Airway Mallampati: II  TM Distance: >3 FB Neck ROM: Full    Dental no notable dental hx. (+) Teeth Intact   Pulmonary former smoker,    Pulmonary exam normal        Cardiovascular Exercise Tolerance: Good Normal cardiovascular examI     Neuro/Psych    GI/Hepatic   Endo/Other    Renal/GU      Musculoskeletal   Abdominal Normal abdominal exam  (+)   Peds  Hematology   Anesthesia Other Findings   Reproductive/Obstetrics                            Anesthesia Physical Anesthesia Plan  ASA: 2  Anesthesia Plan: General   Post-op Pain Management:    Induction: Intravenous  PONV Risk Score and Plan:   Airway Management Planned: Simple Face Mask  Additional Equipment: None  Intra-op Plan:   Post-operative Plan:   Informed Consent: I have reviewed the patients History and Physical, chart, labs and discussed the procedure including the risks, benefits and alternatives for the proposed anesthesia with the patient or authorized representative who has indicated his/her understanding and acceptance.       Plan Discussed with: CRNA  Anesthesia Plan Comments:         Anesthesia Quick Evaluation

## 2020-09-24 ENCOUNTER — Encounter
Admission: RE | Disposition: A | Discharge status: Home / Self Care | Payer: Self-pay | Source: Home / Self Care | Attending: Gastroenterology

## 2020-09-24 ENCOUNTER — Ambulatory Visit: Payer: Medicare Other | Admitting: Anesthesiology

## 2020-09-24 ENCOUNTER — Encounter: Payer: Self-pay | Admitting: Gastroenterology

## 2020-09-24 ENCOUNTER — Ambulatory Visit
Admission: RE | Admit: 2020-09-24 | Discharge: 2020-09-24 | Disposition: A | Discharge status: Home / Self Care | Payer: Medicare Other | Attending: Gastroenterology | Admitting: Gastroenterology

## 2020-09-24 DIAGNOSIS — Z1211 Encounter for screening for malignant neoplasm of colon: Secondary | ICD-10-CM

## 2020-09-24 DIAGNOSIS — K5730 Diverticulosis of large intestine without perforation or abscess without bleeding: Secondary | ICD-10-CM | POA: Diagnosis not present

## 2020-09-24 DIAGNOSIS — D123 Benign neoplasm of transverse colon: Secondary | ICD-10-CM | POA: Insufficient documentation

## 2020-09-24 DIAGNOSIS — K635 Polyp of colon: Secondary | ICD-10-CM | POA: Diagnosis not present

## 2020-09-24 DIAGNOSIS — D122 Benign neoplasm of ascending colon: Secondary | ICD-10-CM | POA: Insufficient documentation

## 2020-09-24 DIAGNOSIS — Z87891 Personal history of nicotine dependence: Secondary | ICD-10-CM | POA: Insufficient documentation

## 2020-09-24 DIAGNOSIS — D126 Benign neoplasm of colon, unspecified: Secondary | ICD-10-CM | POA: Diagnosis not present

## 2020-09-24 DIAGNOSIS — K641 Second degree hemorrhoids: Secondary | ICD-10-CM | POA: Insufficient documentation

## 2020-09-24 HISTORY — PX: COLONOSCOPY WITH PROPOFOL: SHX5780

## 2020-09-24 SURGERY — COLONOSCOPY WITH PROPOFOL
Anesthesia: General

## 2020-09-24 MED ORDER — FENTANYL CITRATE (PF) 100 MCG/2ML IJ SOLN
INTRAMUSCULAR | Status: DC | PRN
  Administered 2020-09-24 (×2): 50 ug via INTRAVENOUS

## 2020-09-24 MED ORDER — LIDOCAINE 2% (20 MG/ML) 5 ML SYRINGE
INTRAMUSCULAR | Status: DC | PRN
  Administered 2020-09-24: 25 mg via INTRAVENOUS

## 2020-09-24 MED ORDER — PROPOFOL 10 MG/ML IV BOLUS
INTRAVENOUS | Status: DC | PRN
  Administered 2020-09-24: 50 mg via INTRAVENOUS
  Administered 2020-09-24: 100 mg via INTRAVENOUS

## 2020-09-24 MED ORDER — SODIUM CHLORIDE 0.9 % IV SOLN: INTRAVENOUS | Status: DC

## 2020-09-24 MED ORDER — FENTANYL CITRATE (PF) 100 MCG/2ML IJ SOLN
INTRAMUSCULAR | Status: AC
  Filled 2020-09-24: qty 2

## 2020-09-24 MED ORDER — PROPOFOL 500 MG/50ML IV EMUL
INTRAVENOUS | Status: DC | PRN
  Administered 2020-09-24: 120 ug/kg/min via INTRAVENOUS

## 2020-09-24 MED ORDER — LIDOCAINE HCL (PF) 2 % IJ SOLN
INTRAMUSCULAR | Status: AC
  Filled 2020-09-24: qty 5

## 2020-09-24 MED ORDER — PROPOFOL 500 MG/50ML IV EMUL
INTRAVENOUS | Status: AC
  Filled 2020-09-24: qty 200

## 2020-09-24 NOTE — Transfer of Care (Signed)
Immediate Anesthesia Transfer of Care Note  Patient: Miguel Christian  Procedure(s) Performed: COLONOSCOPY WITH PROPOFOL  Patient Location: Endoscopy Unit  Anesthesia Type:General  Level of Consciousness: awake  Airway & Oxygen Therapy: Patient Spontanous Breathing  Post-op Assessment: Report given to RN and Post -op Vital signs reviewed and stable  Post vital signs: Reviewed  Last Vitals:  Vitals Value Taken Time  BP 106/72 09/24/20 0807  Temp 36.4 C 09/24/20 0806  Pulse 68 09/24/20 0808  Resp 13 09/24/20 0808  SpO2 95 % 09/24/20 0808  Vitals shown include unvalidated device data.  Last Pain:  Vitals:   09/24/20 0806  TempSrc: Temporal  PainSc: Asleep         Complications: No notable events documented.

## 2020-09-24 NOTE — H&P (Signed)
Miguel Lame, MD [address] [place], [place] [address] Phone: [phone] Fax : [phone]  Primary Care Physician:  Venia Carbon, MD Primary Gastroenterologist:  Dr. Allen Norris  Pre-Procedure History & Physical: HPI:  Miguel Christian is a 65 y.o. male is here for a screening colonoscopy.   Past Medical History:  Diagnosis Date   Alcohol use disorder, moderate, dependence ([place])     Past Surgical History:  Procedure Laterality Date   HEMORRHOID SURGERY  ~2000    Prior to Admission medications   Medication Sig Start Date End Date Taking? Authorizing Provider  Cyanocobalamin (VITAMIN B-12) 5000 MCG SUBL Place under the tongue 2 (two) times a week.   Yes [provider]  diltiazem (CARDIZEM CD) 240 MG 24 hr capsule Take 240 mg by mouth daily. 08/25/20  Yes [provider]  Multiple Vitamins-Minerals (MULTIVITAMIN ADULT EXTRA C PO) Take by mouth.   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  Tobacco Use   Smoking status: Former    Types: Cigarettes    Quit date: 03/19/2020    Years since quitting: 0.5   Smokeless tobacco: Never   Tobacco comments:    Using the nicotine patch  Substance and Sexual Activity   Alcohol use: Yes    Alcohol/week: 7.0 standard drinks    Types: 7 Cans of beer per week     Comment: daily   Drug use: Never   Sexual activity: Yes  Other Topics Concern   Not on file  Social History Narrative   2 step sons---- 1 died 05-13-2010 and they other is estranged   Raised 2 granddaughters      No living will   Wife would be health care POA--granddaughter is alternate   Would accept resuscitation   Not sure about tube feeds   Social Determinants of Health   Financial Resource Strain: Not on file  Food Insecurity: Not on file  Transportation Needs: Not on file  Physical Activity: Not on file  Stress: Not on file  Social Connections: Not on file  Intimate Partner Violence: Not on file    Review of Systems: See HPI, otherwise negative ROS  Physical Exam: BP (!) 145/86   Pulse 71   Temp (!) 96.1 F (35.6 C) (Temporal)   Resp 18   Ht 6' (1.829 m)   Wt 102.1 kg   SpO2 100%   BMI 30.52 kg/m  General:   Alert,  pleasant and cooperative in NAD Head:  Normocephalic and atraumatic. Neck:  Supple; no masses or thyromegaly. Lungs:  Clear throughout to auscultation.    Heart:  Regular  rate and rhythm. Abdomen:  Soft, nontender and nondistended. Normal bowel sounds, without guarding, and without rebound.   Neurologic:  Alert and  oriented x4;  grossly normal neurologically.  Impression/Plan: Miguel Christian is now here to undergo a screening colonoscopy.  Risks, benefits, and alternatives regarding colonoscopy have been reviewed with the patient.  Questions have been answered.  All parties agreeable.

## 2020-09-24 NOTE — Anesthesia Postprocedure Evaluation (Signed)
Anesthesia Post Note  Patient: Miguel Christian Poster  Procedure(s) Performed: COLONOSCOPY WITH PROPOFOL  Patient location during evaluation: PACU Anesthesia Type: General Level of consciousness: awake and alert Pain management: pain level controlled Vital Signs Assessment: post-procedure vital signs reviewed and stable Respiratory status: spontaneous breathing, nonlabored ventilation, respiratory function stable and patient connected to nasal cannula oxygen Cardiovascular status: stable and blood pressure returned to baseline Postop Assessment: no apparent nausea or vomiting Anesthetic complications: no   No notable events documented.   Last Vitals:  Vitals:   09/24/20 0659 09/24/20 0806  BP: (!) 145/86 106/72  Pulse: 71 67  Resp: 18 12  Temp: (!) 35.6 C (!) 36.4 C  SpO2: 100% 95%    Last Pain:  Vitals:   09/24/20 0826  TempSrc:   PainSc: 0-No pain                 [name]

## 2020-09-24 NOTE — Op Note (Signed)
[facility] Gastroenterology Patient Name: Miguel Christian Procedure Date: 09/24/2020 7:33 AM MRN: [id number] Account #: [id number] Date of Birth: Nov 19, 1955 Admit Type: Outpatient Age: 65 Room: [facility] ENDO ROOM 4 Gender: Male Note Status: Finalized Procedure:             Colonoscopy Indications:           Screening for colorectal malignant neoplasm Providers:             [name] MD, MD Referring MD:          Venia Carbon (Referring MD) Medicines:             Propofol per Anesthesia Complications:         No immediate complications. Procedure:             Pre-Anesthesia Assessment:                        - Prior to the procedure, a History and Physical was                         performed, and patient medications and allergies were                         reviewed. The patient's tolerance of previous                         anesthesia was also reviewed. The risks and benefits                         of the procedure and the sedation options and risks                         were discussed with the patient. All questions were                         answered, and informed consent was obtained. Prior                         Anticoagulants: The patient has taken no previous                         anticoagulant or antiplatelet agents. ASA Grade                         Assessment: II - A patient with mild systemic disease.                         After reviewing the risks and benefits, the patient                         was deemed in satisfactory condition to undergo the                         procedure.                        After obtaining informed consent, the colonoscope was  passed under direct vision. Throughout the procedure,                         the patient's blood pressure, pulse, and oxygen                         saturations were monitored continuously. The                         Colonoscope was introduced through the anus  and                         advanced to the the cecum, identified by appendiceal                         orifice and ileocecal valve. The colonoscopy was                         performed without difficulty. The patient tolerated                         the procedure well. The quality of the bowel                         preparation was good. Findings:      The perianal and digital rectal examinations were normal.      A 3 mm polyp was found in the cecum. The polyp was sessile. The polyp       was removed with a cold snare. Resection was complete, but the polyp       tissue was not retrieved.      A 4 mm polyp was found in the ascending colon. The polyp was sessile.       The polyp was removed with a cold snare. Resection and retrieval were       complete.      Two sessile polyps were found in the transverse colon. The polyps were 4       to 5 mm in size. These polyps were removed with a cold snare. Resection       and retrieval were complete.      Two sessile polyps were found in the sigmoid colon. The polyps were 3 to       4 mm in size. These polyps were removed with a cold snare. Resection and       retrieval were complete.      Multiple small-mouthed diverticula were found in the entire colon.      Non-bleeding internal hemorrhoids were found during retroflexion. The       hemorrhoids were Grade II (internal hemorrhoids that prolapse but reduce       spontaneously). Impression:            - One 3 mm polyp in the cecum, removed with a cold                         snare. Complete resection. Polyp tissue not retrieved.                        - One 4 mm polyp in the ascending colon, removed with  a cold snare. Resected and retrieved.                        - Two 4 to 5 mm polyps in the transverse colon,                         removed with a cold snare. Resected and retrieved.                        - Two 3 to 4 mm polyps in the sigmoid colon, removed                          with a cold snare. Resected and retrieved.                        - Diverticulosis in the entire examined colon.                        - Non-bleeding internal hemorrhoids. Recommendation:        - Discharge patient to home.                        - Resume previous diet.                        - Continue present medications.                        - Await pathology results.                        - Repeat colonoscopy in 3 years for surveillance if                         adenomatous otherwise 10 years. Procedure Code(s):     --- Professional ---                        [phone], Colonoscopy, flexible; with removal of                         tumor(s), polyp(s), or other lesion(s) by snare                         technique Diagnosis Code(s):     --- Professional ---                        Z12.11, Encounter for screening for malignant neoplasm                         of colon                        K63.5, Polyp of colon CPT copyright 2019 American Medical Association. All rights reserved. The codes documented in this report are preliminary and upon coder review may  be revised to meet current compliance requirements. [name] MD, MD 09/24/2020 8:06:25 AM This report has been signed electronically. Number of Addenda: 0 Note Initiated On: 09/24/2020 7:33 AM Scope Withdrawal Time: 0 hours 12 minutes 50 seconds  Total  Procedure Duration: 0 hours 18 minutes 44 seconds  Estimated Blood Loss:  Estimated blood loss: none.      [facility]

## 2020-09-25 ENCOUNTER — Encounter: Payer: Self-pay | Admitting: Gastroenterology

## 2020-09-25 LAB — SURGICAL PATHOLOGY

## 2020-09-26 ENCOUNTER — Encounter: Payer: Self-pay | Admitting: Gastroenterology

## 2020-10-29 ENCOUNTER — Telehealth: Payer: Self-pay

## 2020-10-29 MED ORDER — NIRMATRELVIR/RITONAVIR (PAXLOVID)TABLET: 3.0000 | ORAL_TABLET | Freq: Two times a day (BID) | ORAL | 0 refills | Status: AC

## 2020-10-29 NOTE — Telephone Encounter (Signed)
Pt's wife called. Miguel Christian started having cough, scratchy throat, and headache yesterday. Tested positive today. He is vaccinated.

## 2020-10-29 NOTE — Telephone Encounter (Signed)
Spoke to pt's wife. Advised her to tell him Paxlovid was FDA authorized use and not FDA approved. He always has a cough and worked yesterday and tested negative. Woke up today with a headache and tested positive. Wife said they will do OTC relief meds and if he worsens before the end of the week, she will call for Paxlovid.

## 2020-10-29 NOTE — Telephone Encounter (Signed)
Spoke to pt's wife. They will keep it at [facility]. Will decide in the next few days about taking.

## 2020-10-29 NOTE — Telephone Encounter (Signed)
Previous note was entered in error.

## 2020-10-29 NOTE — Telephone Encounter (Signed)
Left message for pt's wife. I need to know symptoms and when they started. Vaccinated?

## 2020-10-29 NOTE — Telephone Encounter (Deleted)
Spoke to pt. She had monoclonal antibodies today.  Will not need medication. More of an FYI.

## 2020-10-29 NOTE — Telephone Encounter (Signed)
Per Dr Silvio Pate:  Faythe Ghee to give him paxlovid if he wants it. Normal dose. Discussed EUA and potential for rebound infection several days after recovery

## 2020-10-29 NOTE — Telephone Encounter (Signed)
Pt  Miguel Christian would like for Dr Silvio Pate to give her a call, her and her husband and her entire household family has tested positive for covid and she was wondering if he could prescribe her husband something bc the next appt that he could have with Dr Silvio Pate would be September 23 and they don't want to see anyone other provider.

## 2020-10-29 NOTE — Addendum Note (Signed)
Addended by: Pilar Grammes on: 10/29/2020 04:16 PM   Modules accepted: Orders

## 2020-12-27 ENCOUNTER — Ambulatory Visit: Payer: Medicare Other

## 2021-01-21 NOTE — Progress Notes (Signed)
Electrophysiology Office Follow up Visit Note:    Date:  01/22/2021   ID:  Miguel Christian, DOB [birth year], MRN [id number]  PCP:  Venia Carbon, MD  [facility] Cardiologist:  None  [facility] Electrophysiologist:  Vickie Epley, MD    Interval History:    Miguel Christian is a 65 y.o. male who presents for a follow up visit. They were last seen in clinic May 22, 2020 for palpitations.  He has a diagnosis of symptomatic PACs and PVCs.  He is maintained on diltiazem 240 mg by mouth once daily.  He presents today for follow-up.  He is done overall quite well since I last saw him.  He has good days and bad days with his PVCs but in general there are more good days and bad days.  He also complains of GERD symptoms and is asking whether or not he can be prescribed omeprazole.     Past Medical History:  Diagnosis Date   Alcohol use disorder, moderate, dependence ([place])     Past Surgical History:  Procedure Laterality Date   COLONOSCOPY WITH PROPOFOL N/A 09/24/2020   Procedure: COLONOSCOPY WITH PROPOFOL;  Surgeon: [name], MD;  Location: [facility] ENDOSCOPY;  Service: Endoscopy;  Laterality: N/A;   HEMORRHOID SURGERY  ~2000    Current Medications: Current Meds  Medication Sig   Cyanocobalamin (VITAMIN B-12) 5000 MCG SUBL Place under the tongue 2 (two) times a week.   diltiazem (CARDIZEM CD) 240 MG 24 hr capsule Take 240 mg by mouth daily.   Multiple Vitamins-Minerals (MULTIVITAMIN ADULT EXTRA C PO) Take by mouth.   omeprazole (PRILOSEC) 20 MG capsule Take 1 capsule (20 mg total) by mouth daily.     Allergies:   Patient has no known allergies.   Social History   Socioeconomic History   Marital status: Married    Spouse name: Not on file   Number of children: Not on file   Years of education: Not on file   Highest education level: Not on file  Occupational History   Occupation: Engineer, drilling    Comment: Retired   Occupation: SIde work    Comment: Landscaping   Tobacco Use   Smoking status: Former    Types: Cigarettes    Quit date: 03/19/2020    Years since quitting: 0.8   Smokeless tobacco: Never   Tobacco comments:    Using the nicotine patch  Substance and Sexual Activity   Alcohol use: Yes    Alcohol/week: 7.0 standard drinks    Types: 7 Cans of beer per week    Comment: daily   Drug use: Never   Sexual activity: Yes  Other Topics Concern   Not on file  Social History Narrative   2 step sons---- 1 died 05-14-2010 and they other is estranged   Raised 2 granddaughters      No living will   Wife would be health care POA--granddaughter is alternate   Would accept resuscitation   Not sure about tube feeds   Social Determinants of Health   Financial Resource Strain: Not on file  Food Insecurity: Not on file  Transportation Needs: Not on file  Physical Activity: Not on file  Stress: Not on file  Social Connections: Not on file     Family History: The patient's family history includes Alcohol abuse in his brother and father; Atrial fibrillation in his brother; Colon cancer in his maternal uncle; Diabetes in his brother; Hearing loss in his maternal uncle;  Hypertension in his brother.  ROS:   Please see the history of present illness.    All other systems reviewed and are negative.  EKGs/Labs/Other Studies Reviewed:    The following studies were reviewed today:    EKG:  The ekg ordered today demonstrates sinus rhythm.  No PVCs.  No PACs.  Recent Labs: 03/18/2020: ALT 28; BUN 9; Creatinine, Ser 0.93; Hemoglobin 15.4; Platelets 232.0; Potassium 4.6; Sodium 137  Recent Lipid Panel No results found for: CHOL, TRIG, HDL, CHOLHDL, VLDL, LDLCALC, LDLDIRECT  Physical Exam:    VS:  BP 126/76   Pulse 64   Ht 6' (1.829 m)   Wt 237 lb (107.5 kg)   SpO2 97%   BMI 32.14 kg/m     Wt Readings from Last 3 Encounters:  01/22/21 237 lb (107.5 kg)  09/24/20 225 lb (102.1 kg)  08/07/20 225 lb (102.1 kg)     GEN:  Well nourished, well  developed in no acute distress HEENT: Normal NECK: No JVD; No carotid bruits LYMPHATICS: No lymphadenopathy CARDIAC: RRR, no murmurs, rubs, gallops RESPIRATORY:  Clear to auscultation without rales, wheezing or rhonchi  ABDOMEN: Soft, non-tender, non-distended MUSCULOSKELETAL:  No edema; No deformity  SKIN: Warm and dry NEUROLOGIC:  Alert and oriented x 3 PSYCHIATRIC:  Normal affect        ASSESSMENT:    1. Palpitations   2. SVT (supraventricular tachycardia) ([place])   3. Alcohol use disorder, moderate, dependence ([facility])    PLAN:    In order of problems listed above:   #Palpitations Improved on Cardizem to 40 mg by mouth once daily.  For now, would recommend continuing this drug at this dose.  Reassured him that he will continue to have some PVCs but in general we are looking for a reduction in the overall burden.  #GERD Trial of omeprazole.  He should follow-up with his primary care physician about this  Patient is transferring his care to [facility] because of financial assistance offered there.  I will put in referral to [facility] electrophysiology.  I am available if needed.      Medication Adjustments/Labs and Tests Ordered: Current medicines are reviewed at length with the patient today.  Concerns regarding medicines are outlined above.  Orders Placed This Encounter  Procedures   EKG 12-Lead    Meds ordered this encounter  Medications   omeprazole (PRILOSEC) 20 MG capsule    Sig: Take 1 capsule (20 mg total) by mouth daily.    Dispense:  30 capsule    Refill:  2      Signed, Lars Mage, MD, [facility], [facility] 01/22/2021 11:46 AM    Electrophysiology [facility]

## 2021-01-22 ENCOUNTER — Ambulatory Visit: Payer: Medicare Other | Admitting: Cardiology

## 2021-01-22 ENCOUNTER — Encounter: Payer: Self-pay | Admitting: Cardiology

## 2021-01-22 ENCOUNTER — Other Ambulatory Visit: Payer: Self-pay

## 2021-01-22 VITALS — BP 126/76 | HR 64 | Ht 72.0 in | Wt 237.0 lb

## 2021-01-22 DIAGNOSIS — F1020 Alcohol dependence, uncomplicated: Secondary | ICD-10-CM

## 2021-01-22 DIAGNOSIS — R002 Palpitations: Secondary | ICD-10-CM | POA: Diagnosis not present

## 2021-01-22 DIAGNOSIS — I471 Supraventricular tachycardia: Secondary | ICD-10-CM | POA: Diagnosis not present

## 2021-01-22 DIAGNOSIS — K219 Gastro-esophageal reflux disease without esophagitis: Secondary | ICD-10-CM | POA: Diagnosis not present

## 2021-01-22 MED ORDER — OMEPRAZOLE 20 MG PO CPDR: 20.0000 mg | DELAYED_RELEASE_CAPSULE | Freq: Every day | ORAL | 2 refills | Status: DC

## 2021-01-22 NOTE — Patient Instructions (Addendum)
Medication Instructions:  Your physician has recommended you make the following change in your medication:    START taking omeprazole 20 mg-  Take one tablet by mouth daily  Lab Work: None ordered. If you have labs (blood work) drawn today and your tests are completely normal, you will receive your results only by: [place] (if you have MyChart) OR A paper copy in the mail If you have any lab test that is abnormal or we need to change your treatment, we will call you to review the results.  Testing/Procedures: None ordered.  Follow-Up: At [facility], you and your health needs are our priority.  As part of our continuing mission to provide you with exceptional heart care, we have created designated Provider Care Teams.  These Care Teams include your primary Cardiologist (physician) and Advanced Practice Providers (APPs -  Physician Assistants and Nurse Practitioners) who all work together to provide you with the care you need, when you need it.  Your next appointment:   Your physician wants you to follow-up in: as needed with Dr. Quentin Ore

## 2021-04-18 ENCOUNTER — Telehealth: Payer: Self-pay | Admitting: Cardiology

## 2021-04-18 NOTE — Telephone Encounter (Signed)
Spoke with [employer] in [place] office.  Referral in place but closed as complete.  ? ?Opened and faxed with demos notes and imaging to Dr. Willis Modena . ? ?Called wife to notify and she will touch base with that office on Monday.   ? ? ? ? ?

## 2021-04-18 NOTE — Telephone Encounter (Signed)
Please call to discuss referral to Dr. Willis Modena. Wife states a referral is needed.

## 2021-04-21 NOTE — Telephone Encounter (Signed)
Dr. Willis Modena office called to discuss updated fax number .  Re faxed referral .  ?

## 2021-05-13 DIAGNOSIS — R002 Palpitations: Secondary | ICD-10-CM | POA: Diagnosis not present

## 2021-05-13 DIAGNOSIS — I493 Ventricular premature depolarization: Secondary | ICD-10-CM | POA: Diagnosis not present

## 2021-05-13 DIAGNOSIS — I471 Supraventricular tachycardia: Secondary | ICD-10-CM | POA: Diagnosis not present

## 2021-05-13 DIAGNOSIS — F17210 Nicotine dependence, cigarettes, uncomplicated: Secondary | ICD-10-CM | POA: Diagnosis not present

## 2021-05-14 DIAGNOSIS — I471 Supraventricular tachycardia: Secondary | ICD-10-CM | POA: Diagnosis not present

## 2021-05-26 ENCOUNTER — Other Ambulatory Visit: Payer: Self-pay | Admitting: Cardiology

## 2021-06-03 ENCOUNTER — Other Ambulatory Visit: Payer: Self-pay | Admitting: Cardiology

## 2021-06-03 NOTE — Telephone Encounter (Signed)
Please advise if ok to refill Diltiazem 240 mg capsule qd. ?Historical provider medication. ?

## 2021-08-13 ENCOUNTER — Encounter: Payer: Medicare Other | Admitting: Internal Medicine

## 2021-09-19 ENCOUNTER — Encounter: Payer: Self-pay | Admitting: Internal Medicine

## 2021-09-19 ENCOUNTER — Ambulatory Visit (INDEPENDENT_AMBULATORY_CARE_PROVIDER_SITE_OTHER): Payer: Medicare Other | Admitting: Internal Medicine

## 2021-09-19 VITALS — BP 128/72 | HR 83 | Temp 97.8°F | Ht 70.75 in | Wt 228.0 lb

## 2021-09-19 DIAGNOSIS — I471 Supraventricular tachycardia: Secondary | ICD-10-CM

## 2021-09-19 DIAGNOSIS — E538 Deficiency of other specified B group vitamins: Secondary | ICD-10-CM

## 2021-09-19 DIAGNOSIS — Z125 Encounter for screening for malignant neoplasm of prostate: Secondary | ICD-10-CM | POA: Diagnosis not present

## 2021-09-19 DIAGNOSIS — Z0000 Encounter for general adult medical examination without abnormal findings: Secondary | ICD-10-CM | POA: Diagnosis not present

## 2021-09-19 DIAGNOSIS — F1020 Alcohol dependence, uncomplicated: Secondary | ICD-10-CM

## 2021-09-19 DIAGNOSIS — Z23 Encounter for immunization: Secondary | ICD-10-CM | POA: Diagnosis not present

## 2021-09-19 LAB — VITAMIN B12: Vitamin B-12: 617 pg/mL (ref 211–911)

## 2021-09-19 LAB — COMPREHENSIVE METABOLIC PANEL
ALT: 34 U/L (ref 0–53)
AST: 29 U/L (ref 0–37)
Albumin: 4.6 g/dL (ref 3.5–5.2)
Alkaline Phosphatase: 82 U/L (ref 39–117)
BUN: 9 mg/dL (ref 6–23)
CO2: 30 mEq/L (ref 19–32)
Calcium: 9.7 mg/dL (ref 8.4–10.5)
Chloride: 101 mEq/L (ref 96–112)
Creatinine, Ser: 0.97 mg/dL (ref 0.40–1.50)
GFR: 81.1 mL/min (ref >60.00)
Glucose, Bld: 96 mg/dL (ref 70–99)
Potassium: 4.8 mEq/L (ref 3.5–5.1)
Sodium: 137 mEq/L (ref 135–145)
Total Bilirubin: 0.7 mg/dL (ref 0.2–1.2)
Total Protein: 7.6 g/dL (ref 6.0–8.3)

## 2021-09-19 LAB — CBC
HCT: 46.7 % (ref 39.0–52.0)
Hemoglobin: 15.9 g/dL (ref 13.0–17.0)
MCHC: 34.1 g/dL (ref 30.0–36.0)
MCV: 99.4 fl (ref 78.0–100.0)
Platelets: 220 10*3/uL (ref 150.0–400.0)
RBC: 4.7 Mil/uL (ref 4.22–5.81)
RDW: 13.5 % (ref 11.5–15.5)
WBC: 9.3 10*3/uL (ref 4.0–10.5)

## 2021-09-19 LAB — PSA, MEDICARE: PSA: 58.59 ng/ml — ABNORMAL HIGH (ref 0.10–4.00)

## 2021-09-19 LAB — T4, FREE: Free T4: 0.78 ng/dL (ref 0.60–1.60)

## 2021-09-19 NOTE — Progress Notes (Signed)
Subjective:    Patient ID: Miguel Christian, male    DOB: 31-Jan-1956, 66 y.o.   MRN: [id number]  HPI Here with wife for Medicare wellness visit and follow up of chronic health conditions Reviewed advanced directives Reviewed other doctors---Dr Mazzela---cardiology No hospitalizations or surgery in past year Back to smoking---counseled about stopping Still drinking No exercise--but physically active at work Programmer, applications work) Vision is okay--going to get eyes checked (long overdue) Hearing is okay No falls No depression or anhedonia Independent with instrumental ADLs No sig memory issues  Doing well No recent tachycardia spells Cardiologist wanted sleep study--but he didn't want it  Is drinking again--not as bad as before Only beer---12 per day probably  Takes the omeprazole daily---started by cardiologist Heartburn controlled now No dysphagia  Continues on B12  Current Outpatient Medications on File Prior to Visit  Medication Sig Dispense Refill   Cyanocobalamin (VITAMIN B-12) 5000 MCG SUBL Place under the tongue once a week.     diltiazem (CARDIZEM CD) 240 MG 24 hr capsule Take 240 mg by mouth daily.     Multiple Vitamins-Minerals (MULTIVITAMIN ADULT EXTRA C PO) Take by mouth.     omeprazole (PRILOSEC) 20 MG capsule TAKE 1 CAPSULE(20 MG) BY MOUTH DAILY 30 capsule 11   No current facility-administered medications on file prior to visit.    No Known Allergies  Past Medical History:  Diagnosis Date   Alcohol use disorder, moderate, dependence ([place])     Past Surgical History:  Procedure Laterality Date   COLONOSCOPY WITH PROPOFOL N/A 09/24/2020   Procedure: COLONOSCOPY WITH PROPOFOL;  Surgeon: [name], MD;  Location: [facility] ENDOSCOPY;  Service: Endoscopy;  Laterality: N/A;   HEMORRHOID SURGERY  ~2000    Family History  Problem Relation Age of Onset   Alcohol abuse Father    Alcohol abuse Brother    Atrial fibrillation Brother    Colon cancer Maternal Uncle         2 of them   Hearing loss Maternal Uncle        2 of uncles   Diabetes Brother    Hypertension Brother     Social History   Socioeconomic History   Marital status: Married    Spouse name: Not on file   Number of children: Not on file   Years of education: Not on file   Highest education level: Not on file  Occupational History   Occupation: Engineer, drilling    Comment: Retired   Occupation: SIde work    Comment: Landscaping  Tobacco Use   Smoking status: Former    Types: Cigarettes    Quit date: 03/19/2020    Years since quitting: 1.5   Smokeless tobacco: Never   Tobacco comments:    Using the nicotine patch  Substance and Sexual Activity   Alcohol use: Yes    Alcohol/week: 7.0 standard drinks of alcohol    Types: 7 Cans of beer per week    Comment: daily   Drug use: Never   Sexual activity: Yes  Other Topics Concern   Not on file  Social History Narrative   2 step sons---- 1 died 05-25-2010 and they other is estranged   Raised 2 granddaughters      No living will   Wife would be health care POA--granddaughter is alternate   Would accept resuscitation   Not sure about tube feeds   Social Determinants of Health   Financial Resource Strain: Not on file  Food Insecurity: Not on  file  Transportation Needs: Not on file  Physical Activity: Not on file  Stress: Not on file  Social Connections: Not on file  Intimate Partner Violence: Not on file   Review of Systems Appetite is okay Weight is stable Sleeps 10 hours a night Wears seat belt Full dentures--- did see dentist Public librarian) about sizing of plates No suspicious skin lesions No sig back or joint pains Bowels move fine--no blood Voids fine---frequent nocturia ----not ready for meds     Objective:   Physical Exam Constitutional:      Appearance: Normal appearance.  HENT:     Mouth/Throat:     Comments: No lesions Eyes:     Conjunctiva/sclera: Conjunctivae normal.     Pupils: Pupils are equal, round, and reactive  to light.  Cardiovascular:     Rate and Rhythm: Normal rate and regular rhythm.     Pulses: Normal pulses.     Heart sounds: No murmur heard.    No gallop.  Pulmonary:     Effort: Pulmonary effort is normal.     Breath sounds: Normal breath sounds. No wheezing or rales.  Abdominal:     Palpations: Abdomen is soft.     Tenderness: There is no abdominal tenderness.  Musculoskeletal:     Cervical back: Neck supple.     Right lower leg: No edema.     Left lower leg: No edema.  Lymphadenopathy:     Cervical: No cervical adenopathy.  Skin:    Findings: No lesion or rash.     Comments: Cysts on back  Neurological:     General: No focal deficit present.     Mental Status: He is alert and oriented to person, place, and time.     Comments: Mini-cog normal  Psychiatric:        Mood and Affect: Mood normal.        Behavior: Behavior normal.            Assessment & Plan:

## 2021-09-19 NOTE — Progress Notes (Signed)
Vision Screening   Right eye Left eye Both eyes  Without correction '20/30 20/40 20/25 '$  With correction     Hearing Screening - Comments:: Passed whisper test

## 2021-09-19 NOTE — Assessment & Plan Note (Signed)
Urged him to reduce the amount of beer he drinks

## 2021-09-19 NOTE — Addendum Note (Signed)
Addended by: Pilar Grammes on: 09/19/2021 01:33 PM   Modules accepted: Orders

## 2021-09-19 NOTE — Assessment & Plan Note (Signed)
Will check level on oral supplements

## 2021-09-19 NOTE — Assessment & Plan Note (Addendum)
I have personally reviewed the Medicare Annual Wellness questionnaire and have noted 1. The patient's medical and social history 2. Their use of alcohol, tobacco or illicit drugs 3. Their current medications and supplements 4. The patient's functional ability including ADL's, fall risks, home safety risks and hearing or visual             impairment. 5. Diet and physical activities 6. Evidence for depression or mood disorders  The patients weight, height, BMI and visual acuity have been recorded in the chart I have made referrals, counseling and provided education to the patient based review of the above and I have provided the pt with a written personalized care plan for preventive services.  I have provided you with a copy of your personalized plan for preventive services. Please take the time to review along with your updated medication list.  Td and shingrix at pharmacy Updated COVID and flu vaccine in the fall Pneumovax 23 today Colon due again 2025 Discussed PSA---will check Urged him to stop smoking Doesn't want lung cancer screening

## 2021-09-19 NOTE — Assessment & Plan Note (Signed)
Controlled with the diltiazem 240 daily

## 2021-09-22 ENCOUNTER — Telehealth: Payer: Self-pay

## 2021-09-22 ENCOUNTER — Ambulatory Visit (INDEPENDENT_AMBULATORY_CARE_PROVIDER_SITE_OTHER): Payer: Medicare Other | Admitting: Internal Medicine

## 2021-09-22 ENCOUNTER — Encounter: Payer: Self-pay | Admitting: Internal Medicine

## 2021-09-22 VITALS — BP 130/80 | HR 86 | Temp 97.9°F | Ht 70.75 in | Wt 229.0 lb

## 2021-09-22 DIAGNOSIS — R972 Elevated prostate specific antigen [PSA]: Secondary | ICD-10-CM | POA: Diagnosis not present

## 2021-09-22 DIAGNOSIS — T50A95A Adverse effect of other bacterial vaccines, initial encounter: Secondary | ICD-10-CM | POA: Diagnosis not present

## 2021-09-22 NOTE — Assessment & Plan Note (Signed)
Redness/warmth/pain the day after the vaccine Markedly better now Discussed ice prn--and ibuprofen

## 2021-09-22 NOTE — Progress Notes (Signed)
Subjective:    Patient ID: Miguel Christian, male    DOB: Jun 19, 1955, 66 y.o.   MRN: [id number]  HPI Here due to vaccine reaction With wife  Reviewed the markedly elevated PSA Discussed options---almost certainly will need androgen deprivation and maybe chemo  He prefers [facility]  He has redness in left arm Couldn't even raise arm 2 days ago Wilburn Mylar was moving it better Redness went down to elbow yesterday but better now Pain is better now Tried ibuprofen--helped some Did feel hot 2 days ago  Current Outpatient Medications on File Prior to Visit  Medication Sig Dispense Refill   Cyanocobalamin (VITAMIN B-12) 5000 MCG SUBL Place under the tongue once a week.     diltiazem (CARDIZEM CD) 240 MG 24 hr capsule Take 240 mg by mouth daily.     Multiple Vitamins-Minerals (MULTIVITAMIN ADULT EXTRA C PO) Take by mouth.     omeprazole (PRILOSEC) 20 MG capsule TAKE 1 CAPSULE(20 MG) BY MOUTH DAILY 30 capsule 11   No current facility-administered medications on file prior to visit.    No Known Allergies  Past Medical History:  Diagnosis Date   Alcohol use disorder, moderate, dependence ([place])     Past Surgical History:  Procedure Laterality Date   COLONOSCOPY WITH PROPOFOL N/A 09/24/2020   Procedure: COLONOSCOPY WITH PROPOFOL;  Surgeon: [name], MD;  Location: [facility] ENDOSCOPY;  Service: Endoscopy;  Laterality: N/A;   HEMORRHOID SURGERY  ~2000    Family History  Problem Relation Age of Onset   Alcohol abuse Father    Alcohol abuse Brother    Atrial fibrillation Brother    Colon cancer Maternal Uncle        2 of them   Hearing loss Maternal Uncle        2 of uncles   Diabetes Brother    Hypertension Brother     Social History   Socioeconomic History   Marital status: Married    Spouse name: Not on file   Number of children: Not on file   Years of education: Not on file   Highest education level: Not on file  Occupational History   Occupation: Engineer, drilling    Comment:  Retired   Occupation: SIde work    Comment: Landscaping  Tobacco Use   Smoking status: Former    Types: Cigarettes    Quit date: 03/19/2020    Years since quitting: 1.5   Smokeless tobacco: Never   Tobacco comments:    Using the nicotine patch  Substance and Sexual Activity   Alcohol use: Yes    Alcohol/week: 7.0 standard drinks of alcohol    Types: 7 Cans of beer per week    Comment: daily   Drug use: Never   Sexual activity: Yes  Other Topics Concern   Not on file  Social History Narrative   2 step sons---- 1 died May 28, 2010 and they other is estranged   Raised 2 granddaughters      No living will   Wife would be health care POA--granddaughter is alternate   Would accept resuscitation   Not sure about tube feeds   Social Determinants of Health   Financial Resource Strain: Not on file  Food Insecurity: Not on file  Transportation Needs: Not on file  Physical Activity: Not on file  Stress: Not on file  Social Connections: Not on file  Intimate Partner Violence: Not on file   Review of Systems Sleeps okayno fever     Objective:  Physical Exam Constitutional:      Appearance: Normal appearance.  Musculoskeletal:     Comments: No striking redness in left arm now ROM is better now (abduction over 90 degrees) No tenderness  Neurological:     Mental Status: He is alert.            Assessment & Plan:

## 2021-09-22 NOTE — Assessment & Plan Note (Signed)
Markedly elevated Almost certainly cancer Will set up ASAP with urology (he prefers [facility])

## 2021-09-25 ENCOUNTER — Telehealth: Payer: Self-pay

## 2021-09-25 NOTE — Telephone Encounter (Signed)
Patient's wife is calling in very upset. [facility] is not scheduling them until next Friday 10/03/21 and its not even with Dr.Borawski. Wife wants to know what they need to do. Miguel Christian said that [facility] representatives told her that normally they wait 3+ weeks for appointments and Friday is the best they can do.

## 2021-09-26 NOTE — Telephone Encounter (Signed)
Spoke to pt's wife. She said that made her feel better knowing what Dr Silvio Pate said.

## 2021-11-06 NOTE — Telephone Encounter (Signed)
Error no action needed.

## 2021-12-01 DIAGNOSIS — M898X8 Other specified disorders of bone, other site: Secondary | ICD-10-CM | POA: Diagnosis not present

## 2021-12-24 DIAGNOSIS — C778 Secondary and unspecified malignant neoplasm of lymph nodes of multiple regions: Secondary | ICD-10-CM | POA: Diagnosis not present

## 2021-12-24 DIAGNOSIS — C775 Secondary and unspecified malignant neoplasm of intrapelvic lymph nodes: Secondary | ICD-10-CM | POA: Diagnosis not present

## 2021-12-24 DIAGNOSIS — C772 Secondary and unspecified malignant neoplasm of intra-abdominal lymph nodes: Secondary | ICD-10-CM | POA: Diagnosis not present

## 2021-12-24 DIAGNOSIS — C7951 Secondary malignant neoplasm of bone: Secondary | ICD-10-CM | POA: Diagnosis not present

## 2022-01-12 DIAGNOSIS — C7951 Secondary malignant neoplasm of bone: Secondary | ICD-10-CM | POA: Diagnosis not present

## 2022-01-13 ENCOUNTER — Encounter: Payer: Self-pay | Admitting: Internal Medicine

## 2022-01-13 DIAGNOSIS — C7951 Secondary malignant neoplasm of bone: Secondary | ICD-10-CM | POA: Insufficient documentation

## 2022-01-13 DIAGNOSIS — C61 Malignant neoplasm of prostate: Secondary | ICD-10-CM | POA: Insufficient documentation

## 2022-01-21 DIAGNOSIS — M810 Age-related osteoporosis without current pathological fracture: Secondary | ICD-10-CM | POA: Diagnosis not present

## 2022-01-21 DIAGNOSIS — Z13820 Encounter for screening for osteoporosis: Secondary | ICD-10-CM | POA: Diagnosis not present

## 2022-01-21 DIAGNOSIS — Z5111 Encounter for antineoplastic chemotherapy: Secondary | ICD-10-CM | POA: Diagnosis not present

## 2022-01-21 DIAGNOSIS — C7951 Secondary malignant neoplasm of bone: Secondary | ICD-10-CM | POA: Diagnosis not present

## 2022-01-26 DIAGNOSIS — C7951 Secondary malignant neoplasm of bone: Secondary | ICD-10-CM | POA: Diagnosis not present

## 2022-02-06 DIAGNOSIS — C779 Secondary and unspecified malignant neoplasm of lymph node, unspecified: Secondary | ICD-10-CM | POA: Diagnosis not present

## 2022-02-06 DIAGNOSIS — R3915 Urgency of urination: Secondary | ICD-10-CM | POA: Diagnosis not present

## 2022-02-06 DIAGNOSIS — N3289 Other specified disorders of bladder: Secondary | ICD-10-CM | POA: Diagnosis not present

## 2022-02-06 DIAGNOSIS — R339 Retention of urine, unspecified: Secondary | ICD-10-CM | POA: Diagnosis not present

## 2022-02-06 DIAGNOSIS — R350 Frequency of micturition: Secondary | ICD-10-CM | POA: Diagnosis not present

## 2022-02-06 DIAGNOSIS — R309 Painful micturition, unspecified: Secondary | ICD-10-CM | POA: Diagnosis not present

## 2022-02-06 DIAGNOSIS — C7951 Secondary malignant neoplasm of bone: Secondary | ICD-10-CM | POA: Diagnosis not present

## 2022-02-06 DIAGNOSIS — G8929 Other chronic pain: Secondary | ICD-10-CM | POA: Diagnosis not present

## 2022-02-22 DIAGNOSIS — C779 Secondary and unspecified malignant neoplasm of lymph node, unspecified: Secondary | ICD-10-CM | POA: Diagnosis not present

## 2022-02-22 DIAGNOSIS — C7951 Secondary malignant neoplasm of bone: Secondary | ICD-10-CM | POA: Diagnosis not present

## 2022-02-22 DIAGNOSIS — Z510 Encounter for antineoplastic radiation therapy: Secondary | ICD-10-CM | POA: Diagnosis not present

## 2022-02-23 DIAGNOSIS — F17210 Nicotine dependence, cigarettes, uncomplicated: Secondary | ICD-10-CM | POA: Diagnosis not present

## 2022-02-23 DIAGNOSIS — C7951 Secondary malignant neoplasm of bone: Secondary | ICD-10-CM | POA: Diagnosis not present

## 2022-02-25 DIAGNOSIS — Z510 Encounter for antineoplastic radiation therapy: Secondary | ICD-10-CM | POA: Diagnosis not present

## 2022-02-25 DIAGNOSIS — C7951 Secondary malignant neoplasm of bone: Secondary | ICD-10-CM | POA: Diagnosis not present

## 2022-02-25 DIAGNOSIS — C779 Secondary and unspecified malignant neoplasm of lymph node, unspecified: Secondary | ICD-10-CM | POA: Diagnosis not present

## 2022-02-26 DIAGNOSIS — Z510 Encounter for antineoplastic radiation therapy: Secondary | ICD-10-CM | POA: Diagnosis not present

## 2022-02-26 DIAGNOSIS — C779 Secondary and unspecified malignant neoplasm of lymph node, unspecified: Secondary | ICD-10-CM | POA: Diagnosis not present

## 2022-02-27 DIAGNOSIS — Z510 Encounter for antineoplastic radiation therapy: Secondary | ICD-10-CM | POA: Diagnosis not present

## 2022-02-27 DIAGNOSIS — C779 Secondary and unspecified malignant neoplasm of lymph node, unspecified: Secondary | ICD-10-CM | POA: Diagnosis not present

## 2022-03-03 DIAGNOSIS — C779 Secondary and unspecified malignant neoplasm of lymph node, unspecified: Secondary | ICD-10-CM | POA: Diagnosis not present

## 2022-03-03 DIAGNOSIS — Z510 Encounter for antineoplastic radiation therapy: Secondary | ICD-10-CM | POA: Diagnosis not present

## 2022-03-04 DIAGNOSIS — Z510 Encounter for antineoplastic radiation therapy: Secondary | ICD-10-CM | POA: Diagnosis not present

## 2022-03-04 DIAGNOSIS — C779 Secondary and unspecified malignant neoplasm of lymph node, unspecified: Secondary | ICD-10-CM | POA: Diagnosis not present

## 2022-03-11 DIAGNOSIS — C7951 Secondary malignant neoplasm of bone: Secondary | ICD-10-CM | POA: Diagnosis not present

## 2022-03-11 DIAGNOSIS — Z510 Encounter for antineoplastic radiation therapy: Secondary | ICD-10-CM | POA: Diagnosis not present

## 2022-03-11 DIAGNOSIS — C779 Secondary and unspecified malignant neoplasm of lymph node, unspecified: Secondary | ICD-10-CM | POA: Diagnosis not present

## 2022-03-24 DIAGNOSIS — C7951 Secondary malignant neoplasm of bone: Secondary | ICD-10-CM | POA: Diagnosis not present

## 2022-03-24 DIAGNOSIS — Z510 Encounter for antineoplastic radiation therapy: Secondary | ICD-10-CM | POA: Diagnosis not present

## 2022-03-24 DIAGNOSIS — C779 Secondary and unspecified malignant neoplasm of lymph node, unspecified: Secondary | ICD-10-CM | POA: Diagnosis not present

## 2022-03-25 DIAGNOSIS — Z510 Encounter for antineoplastic radiation therapy: Secondary | ICD-10-CM | POA: Diagnosis not present

## 2022-03-25 DIAGNOSIS — C779 Secondary and unspecified malignant neoplasm of lymph node, unspecified: Secondary | ICD-10-CM | POA: Diagnosis not present

## 2022-03-25 DIAGNOSIS — C7951 Secondary malignant neoplasm of bone: Secondary | ICD-10-CM | POA: Diagnosis not present

## 2022-03-26 DIAGNOSIS — C779 Secondary and unspecified malignant neoplasm of lymph node, unspecified: Secondary | ICD-10-CM | POA: Diagnosis not present

## 2022-03-26 DIAGNOSIS — C7951 Secondary malignant neoplasm of bone: Secondary | ICD-10-CM | POA: Diagnosis not present

## 2022-03-26 DIAGNOSIS — Z510 Encounter for antineoplastic radiation therapy: Secondary | ICD-10-CM | POA: Diagnosis not present

## 2022-03-27 DIAGNOSIS — Z510 Encounter for antineoplastic radiation therapy: Secondary | ICD-10-CM | POA: Diagnosis not present

## 2022-03-27 DIAGNOSIS — C779 Secondary and unspecified malignant neoplasm of lymph node, unspecified: Secondary | ICD-10-CM | POA: Diagnosis not present

## 2022-03-27 DIAGNOSIS — C7951 Secondary malignant neoplasm of bone: Secondary | ICD-10-CM | POA: Diagnosis not present

## 2022-03-30 DIAGNOSIS — C7951 Secondary malignant neoplasm of bone: Secondary | ICD-10-CM | POA: Diagnosis not present

## 2022-03-30 DIAGNOSIS — Z510 Encounter for antineoplastic radiation therapy: Secondary | ICD-10-CM | POA: Diagnosis not present

## 2022-03-30 DIAGNOSIS — C779 Secondary and unspecified malignant neoplasm of lymph node, unspecified: Secondary | ICD-10-CM | POA: Diagnosis not present

## 2022-03-31 DIAGNOSIS — C7951 Secondary malignant neoplasm of bone: Secondary | ICD-10-CM | POA: Diagnosis not present

## 2022-03-31 DIAGNOSIS — C779 Secondary and unspecified malignant neoplasm of lymph node, unspecified: Secondary | ICD-10-CM | POA: Diagnosis not present

## 2022-03-31 DIAGNOSIS — Z510 Encounter for antineoplastic radiation therapy: Secondary | ICD-10-CM | POA: Diagnosis not present

## 2022-04-01 DIAGNOSIS — C779 Secondary and unspecified malignant neoplasm of lymph node, unspecified: Secondary | ICD-10-CM | POA: Diagnosis not present

## 2022-04-01 DIAGNOSIS — C7951 Secondary malignant neoplasm of bone: Secondary | ICD-10-CM | POA: Diagnosis not present

## 2022-04-01 DIAGNOSIS — Z510 Encounter for antineoplastic radiation therapy: Secondary | ICD-10-CM | POA: Diagnosis not present

## 2022-04-02 DIAGNOSIS — Z510 Encounter for antineoplastic radiation therapy: Secondary | ICD-10-CM | POA: Diagnosis not present

## 2022-04-02 DIAGNOSIS — C7951 Secondary malignant neoplasm of bone: Secondary | ICD-10-CM | POA: Diagnosis not present

## 2022-04-02 DIAGNOSIS — C779 Secondary and unspecified malignant neoplasm of lymph node, unspecified: Secondary | ICD-10-CM | POA: Diagnosis not present

## 2022-04-03 DIAGNOSIS — C779 Secondary and unspecified malignant neoplasm of lymph node, unspecified: Secondary | ICD-10-CM | POA: Diagnosis not present

## 2022-04-03 DIAGNOSIS — C7951 Secondary malignant neoplasm of bone: Secondary | ICD-10-CM | POA: Diagnosis not present

## 2022-04-03 DIAGNOSIS — Z510 Encounter for antineoplastic radiation therapy: Secondary | ICD-10-CM | POA: Diagnosis not present

## 2022-04-07 DIAGNOSIS — Z510 Encounter for antineoplastic radiation therapy: Secondary | ICD-10-CM | POA: Diagnosis not present

## 2022-04-07 DIAGNOSIS — C7951 Secondary malignant neoplasm of bone: Secondary | ICD-10-CM | POA: Diagnosis not present

## 2022-04-07 DIAGNOSIS — C779 Secondary and unspecified malignant neoplasm of lymph node, unspecified: Secondary | ICD-10-CM | POA: Diagnosis not present

## 2022-04-08 DIAGNOSIS — C7951 Secondary malignant neoplasm of bone: Secondary | ICD-10-CM | POA: Diagnosis not present

## 2022-04-08 DIAGNOSIS — Z510 Encounter for antineoplastic radiation therapy: Secondary | ICD-10-CM | POA: Diagnosis not present

## 2022-04-08 DIAGNOSIS — C779 Secondary and unspecified malignant neoplasm of lymph node, unspecified: Secondary | ICD-10-CM | POA: Diagnosis not present

## 2022-04-09 DIAGNOSIS — C779 Secondary and unspecified malignant neoplasm of lymph node, unspecified: Secondary | ICD-10-CM | POA: Diagnosis not present

## 2022-04-09 DIAGNOSIS — C7951 Secondary malignant neoplasm of bone: Secondary | ICD-10-CM | POA: Diagnosis not present

## 2022-04-09 DIAGNOSIS — Z510 Encounter for antineoplastic radiation therapy: Secondary | ICD-10-CM | POA: Diagnosis not present

## 2022-04-10 DIAGNOSIS — Z510 Encounter for antineoplastic radiation therapy: Secondary | ICD-10-CM | POA: Diagnosis not present

## 2022-04-10 DIAGNOSIS — C7951 Secondary malignant neoplasm of bone: Secondary | ICD-10-CM | POA: Diagnosis not present

## 2022-04-10 DIAGNOSIS — C779 Secondary and unspecified malignant neoplasm of lymph node, unspecified: Secondary | ICD-10-CM | POA: Diagnosis not present

## 2022-04-14 DIAGNOSIS — C779 Secondary and unspecified malignant neoplasm of lymph node, unspecified: Secondary | ICD-10-CM | POA: Diagnosis not present

## 2022-04-14 DIAGNOSIS — C7951 Secondary malignant neoplasm of bone: Secondary | ICD-10-CM | POA: Diagnosis not present

## 2022-04-14 DIAGNOSIS — Z510 Encounter for antineoplastic radiation therapy: Secondary | ICD-10-CM | POA: Diagnosis not present

## 2022-04-15 DIAGNOSIS — C779 Secondary and unspecified malignant neoplasm of lymph node, unspecified: Secondary | ICD-10-CM | POA: Diagnosis not present

## 2022-04-15 DIAGNOSIS — C7951 Secondary malignant neoplasm of bone: Secondary | ICD-10-CM | POA: Diagnosis not present

## 2022-04-15 DIAGNOSIS — Z510 Encounter for antineoplastic radiation therapy: Secondary | ICD-10-CM | POA: Diagnosis not present

## 2022-04-16 DIAGNOSIS — Z510 Encounter for antineoplastic radiation therapy: Secondary | ICD-10-CM | POA: Diagnosis not present

## 2022-04-16 DIAGNOSIS — C779 Secondary and unspecified malignant neoplasm of lymph node, unspecified: Secondary | ICD-10-CM | POA: Diagnosis not present

## 2022-04-16 DIAGNOSIS — C7951 Secondary malignant neoplasm of bone: Secondary | ICD-10-CM | POA: Diagnosis not present

## 2022-04-17 DIAGNOSIS — C779 Secondary and unspecified malignant neoplasm of lymph node, unspecified: Secondary | ICD-10-CM | POA: Diagnosis not present

## 2022-04-17 DIAGNOSIS — C7951 Secondary malignant neoplasm of bone: Secondary | ICD-10-CM | POA: Diagnosis not present

## 2022-04-17 DIAGNOSIS — Z510 Encounter for antineoplastic radiation therapy: Secondary | ICD-10-CM | POA: Diagnosis not present

## 2022-04-20 DIAGNOSIS — C779 Secondary and unspecified malignant neoplasm of lymph node, unspecified: Secondary | ICD-10-CM | POA: Diagnosis not present

## 2022-04-20 DIAGNOSIS — C7951 Secondary malignant neoplasm of bone: Secondary | ICD-10-CM | POA: Diagnosis not present

## 2022-04-20 DIAGNOSIS — Z510 Encounter for antineoplastic radiation therapy: Secondary | ICD-10-CM | POA: Diagnosis not present

## 2022-04-21 DIAGNOSIS — C7951 Secondary malignant neoplasm of bone: Secondary | ICD-10-CM | POA: Diagnosis not present

## 2022-04-21 DIAGNOSIS — C779 Secondary and unspecified malignant neoplasm of lymph node, unspecified: Secondary | ICD-10-CM | POA: Diagnosis not present

## 2022-04-21 DIAGNOSIS — Z510 Encounter for antineoplastic radiation therapy: Secondary | ICD-10-CM | POA: Diagnosis not present

## 2022-04-22 DIAGNOSIS — C779 Secondary and unspecified malignant neoplasm of lymph node, unspecified: Secondary | ICD-10-CM | POA: Diagnosis not present

## 2022-04-22 DIAGNOSIS — C7951 Secondary malignant neoplasm of bone: Secondary | ICD-10-CM | POA: Diagnosis not present

## 2022-04-22 DIAGNOSIS — Z510 Encounter for antineoplastic radiation therapy: Secondary | ICD-10-CM | POA: Diagnosis not present

## 2022-04-23 DIAGNOSIS — C7951 Secondary malignant neoplasm of bone: Secondary | ICD-10-CM | POA: Diagnosis not present

## 2022-04-23 DIAGNOSIS — C779 Secondary and unspecified malignant neoplasm of lymph node, unspecified: Secondary | ICD-10-CM | POA: Diagnosis not present

## 2022-04-23 DIAGNOSIS — Z510 Encounter for antineoplastic radiation therapy: Secondary | ICD-10-CM | POA: Diagnosis not present

## 2022-04-27 DIAGNOSIS — C7951 Secondary malignant neoplasm of bone: Secondary | ICD-10-CM | POA: Diagnosis not present

## 2022-06-05 DIAGNOSIS — M5442 Lumbago with sciatica, left side: Secondary | ICD-10-CM | POA: Diagnosis not present

## 2022-06-05 DIAGNOSIS — R69 Illness, unspecified: Secondary | ICD-10-CM | POA: Diagnosis not present

## 2022-06-05 DIAGNOSIS — C61 Malignant neoplasm of prostate: Secondary | ICD-10-CM | POA: Diagnosis not present

## 2022-06-05 DIAGNOSIS — M545 Low back pain, unspecified: Secondary | ICD-10-CM | POA: Diagnosis not present

## 2022-06-05 DIAGNOSIS — C7951 Secondary malignant neoplasm of bone: Secondary | ICD-10-CM | POA: Diagnosis not present

## 2022-06-05 DIAGNOSIS — M5432 Sciatica, left side: Secondary | ICD-10-CM | POA: Diagnosis not present

## 2022-06-08 DIAGNOSIS — M549 Dorsalgia, unspecified: Secondary | ICD-10-CM | POA: Diagnosis not present

## 2022-06-11 DIAGNOSIS — C779 Secondary and unspecified malignant neoplasm of lymph node, unspecified: Secondary | ICD-10-CM | POA: Diagnosis not present

## 2022-06-11 DIAGNOSIS — Z510 Encounter for antineoplastic radiation therapy: Secondary | ICD-10-CM | POA: Diagnosis not present

## 2022-06-11 DIAGNOSIS — C61 Malignant neoplasm of prostate: Secondary | ICD-10-CM | POA: Diagnosis not present

## 2022-06-18 DIAGNOSIS — M5430 Sciatica, unspecified side: Secondary | ICD-10-CM | POA: Diagnosis not present

## 2022-06-22 DIAGNOSIS — M47816 Spondylosis without myelopathy or radiculopathy, lumbar region: Secondary | ICD-10-CM | POA: Diagnosis not present

## 2022-06-22 DIAGNOSIS — C7951 Secondary malignant neoplasm of bone: Secondary | ICD-10-CM | POA: Diagnosis not present

## 2022-06-22 DIAGNOSIS — C61 Malignant neoplasm of prostate: Secondary | ICD-10-CM | POA: Diagnosis not present

## 2022-06-22 DIAGNOSIS — M5136 Other intervertebral disc degeneration, lumbar region: Secondary | ICD-10-CM | POA: Diagnosis not present

## 2022-06-22 DIAGNOSIS — M5430 Sciatica, unspecified side: Secondary | ICD-10-CM | POA: Diagnosis not present

## 2022-06-22 DIAGNOSIS — M48061 Spinal stenosis, lumbar region without neurogenic claudication: Secondary | ICD-10-CM | POA: Diagnosis not present

## 2022-06-23 DIAGNOSIS — C7951 Secondary malignant neoplasm of bone: Secondary | ICD-10-CM | POA: Diagnosis not present

## 2022-06-23 DIAGNOSIS — C61 Malignant neoplasm of prostate: Secondary | ICD-10-CM | POA: Diagnosis not present

## 2022-06-30 DIAGNOSIS — C7951 Secondary malignant neoplasm of bone: Secondary | ICD-10-CM | POA: Diagnosis not present

## 2022-06-30 DIAGNOSIS — M5416 Radiculopathy, lumbar region: Secondary | ICD-10-CM | POA: Diagnosis not present

## 2022-06-30 DIAGNOSIS — C61 Malignant neoplasm of prostate: Secondary | ICD-10-CM | POA: Diagnosis not present

## 2022-06-30 DIAGNOSIS — M5440 Lumbago with sciatica, unspecified side: Secondary | ICD-10-CM | POA: Diagnosis not present

## 2022-06-30 DIAGNOSIS — F17210 Nicotine dependence, cigarettes, uncomplicated: Secondary | ICD-10-CM | POA: Diagnosis not present

## 2022-06-30 DIAGNOSIS — M5136 Other intervertebral disc degeneration, lumbar region: Secondary | ICD-10-CM | POA: Diagnosis not present

## 2022-06-30 DIAGNOSIS — Z923 Personal history of irradiation: Secondary | ICD-10-CM | POA: Diagnosis not present

## 2022-06-30 DIAGNOSIS — M5430 Sciatica, unspecified side: Secondary | ICD-10-CM | POA: Diagnosis not present

## 2022-06-30 DIAGNOSIS — M47816 Spondylosis without myelopathy or radiculopathy, lumbar region: Secondary | ICD-10-CM | POA: Diagnosis not present

## 2022-08-19 DIAGNOSIS — C61 Malignant neoplasm of prostate: Secondary | ICD-10-CM | POA: Diagnosis not present

## 2022-08-19 DIAGNOSIS — C7951 Secondary malignant neoplasm of bone: Secondary | ICD-10-CM | POA: Diagnosis not present

## 2022-08-24 DIAGNOSIS — F17210 Nicotine dependence, cigarettes, uncomplicated: Secondary | ICD-10-CM | POA: Diagnosis not present

## 2022-08-24 DIAGNOSIS — F1020 Alcohol dependence, uncomplicated: Secondary | ICD-10-CM | POA: Diagnosis not present

## 2022-08-24 DIAGNOSIS — M5410 Radiculopathy, site unspecified: Secondary | ICD-10-CM | POA: Diagnosis not present

## 2022-08-24 DIAGNOSIS — Z79899 Other long term (current) drug therapy: Secondary | ICD-10-CM | POA: Diagnosis not present

## 2022-08-24 DIAGNOSIS — C61 Malignant neoplasm of prostate: Secondary | ICD-10-CM | POA: Diagnosis not present

## 2022-08-24 DIAGNOSIS — C7951 Secondary malignant neoplasm of bone: Secondary | ICD-10-CM | POA: Diagnosis not present

## 2022-09-07 DIAGNOSIS — M79605 Pain in left leg: Secondary | ICD-10-CM | POA: Diagnosis not present

## 2022-09-07 DIAGNOSIS — R600 Localized edema: Secondary | ICD-10-CM | POA: Diagnosis not present

## 2022-09-10 DIAGNOSIS — C61 Malignant neoplasm of prostate: Secondary | ICD-10-CM | POA: Diagnosis not present

## 2022-09-23 ENCOUNTER — Ambulatory Visit: Payer: Medicare HMO | Admitting: Internal Medicine

## 2022-09-23 ENCOUNTER — Encounter: Payer: Self-pay | Admitting: Internal Medicine

## 2022-09-23 VITALS — BP 130/84 | HR 79 | Temp 97.5°F | Ht 70.5 in | Wt 223.0 lb

## 2022-09-23 DIAGNOSIS — C61 Malignant neoplasm of prostate: Secondary | ICD-10-CM | POA: Diagnosis not present

## 2022-09-23 DIAGNOSIS — Z0000 Encounter for general adult medical examination without abnormal findings: Secondary | ICD-10-CM | POA: Diagnosis not present

## 2022-09-23 DIAGNOSIS — C7951 Secondary malignant neoplasm of bone: Secondary | ICD-10-CM | POA: Diagnosis not present

## 2022-09-23 DIAGNOSIS — F1020 Alcohol dependence, uncomplicated: Secondary | ICD-10-CM

## 2022-09-23 DIAGNOSIS — K219 Gastro-esophageal reflux disease without esophagitis: Secondary | ICD-10-CM | POA: Insufficient documentation

## 2022-09-23 DIAGNOSIS — I471 Supraventricular tachycardia, unspecified: Secondary | ICD-10-CM | POA: Diagnosis not present

## 2022-09-23 NOTE — Assessment & Plan Note (Signed)
No evidence of recurrence on the diltiazem 240mg  daily

## 2022-09-23 NOTE — Assessment & Plan Note (Signed)
I have personally reviewed the Medicare Annual Wellness questionnaire and have noted 1. The patient's medical and social history 2. Their use of alcohol, tobacco or illicit drugs 3. Their current medications and supplements 4. The patient's functional ability including ADL's, fall risks, home safety risks and hearing or visual             impairment. 5. Diet and physical activities 6. Evidence for depression or mood disorders  The patients weight, height, BMI and visual acuity have been recorded in the chart I have made referrals, counseling and provided education to the patient based review of the above and I have provided the pt with a written personalized care plan for preventive services.  I have provided you with a copy of your personalized plan for preventive services. Please take the time to review along with your updated medication list.  Colon due next year Discussed trying to work on muscle strength--but handicapped permit given Urged him to stop smoking--doesn't want lung cancer screening Doesn't want vaccines--but urged him to get update on Td

## 2022-09-23 NOTE — Progress Notes (Signed)
Subjective:    Patient ID: Miguel Christian, male    DOB: 04-21-1955, 67 y.o.   MRN: [id number]  HPI Here with wife for Medicare wellness visit and follow up of chronic health conditions Reviewed advanced directives Reviewed other doctors---Dr Hyacinth Meeker [facility]) -oncology, Dr Pearlstein--radiation oncology, Dr Marisa Hua, Dr Mazzela--cardiology No surgery or hospitalizations in the past year Still smoking--not ready to stop Has had some falls--just some abrasions No depression or anhedonia Vision is okay--needs an exam (going to [facility]) Hearing is okay Not able to exercise---uses cane. Tries to loosen up around yard, etc Can help with shopping and housework--still does yard work (mostly riding Firefighter and some weed whacking) Memory is okay  Prostate cancer found last year Had RT to spine and prostate Lupron injections and oral nubeqa  Last PSA close to 0  Cut back on beer with the cancer Rx Still up to 6-8 daily---but sometimes less On gabapentin for pain--sciatica type (L4-5 lesions)--possible damage from the RT Taking 900 tid Will be seeing a neurologist soon No strength in left leg--has fallen due to this  Did have some leg swelling No DVT on exam No palpitations  No chest pain or SOB No dizziness or syncope  No heartburn on omeprazole No dysphagia  Current Outpatient Medications on File Prior to Visit  Medication Sig Dispense Refill   Cyanocobalamin (VITAMIN B-12) 5000 MCG SUBL Place under the tongue once a week.     darolutamide (NUBEQA) 300 MG tablet Take by mouth.     gabapentin (NEURONTIN) 300 MG capsule Take 900 mg by mouth 3 (three) times daily.     Multiple Vitamins-Minerals (MULTIVITAMIN ADULT EXTRA C PO) Take by mouth.     omeprazole (PRILOSEC) 20 MG capsule TAKE 1 CAPSULE(20 MG) BY MOUTH DAILY 30 capsule 11   verapamil (CALAN-SR) 180 MG CR tablet Take 180 mg by mouth daily.     No current facility-administered medications on file prior to visit.    No Known  Allergies  Past Medical History:  Diagnosis Date   Alcohol use disorder, moderate, dependence ([facility])    Prostate cancer [facility])     Past Surgical History:  Procedure Laterality Date   COLONOSCOPY WITH PROPOFOL N/A 09/24/2020   Procedure: COLONOSCOPY WITH PROPOFOL;  Surgeon: Midge Minium, MD;  Location: [facility] ENDOSCOPY;  Service: Endoscopy;  Laterality: N/A;   HEMORRHOID SURGERY  ~2000    Family History  Problem Relation Age of Onset   Alcohol abuse Father    Alcohol abuse Brother    Atrial fibrillation Brother    Colon cancer Maternal Uncle        2 of them   Hearing loss Maternal Uncle        2 of uncles   Diabetes Brother    Hypertension Brother     Social History   Socioeconomic History   Marital status: Married    Spouse name: Not on file   Number of children: Not on file   Years of education: Not on file   Highest education level: Not on file  Occupational History   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  Tobacco Use   Smoking status: Former    Current packs/day: 0.00    Types: Cigarettes    Quit date: 03/19/2020    Years since quitting: 2.5   Smokeless tobacco: Never   Tobacco comments:    Using the nicotine patch  Substance and Sexual Activity  Alcohol use: Yes    Alcohol/week: 7.0 standard drinks of alcohol    Types: 7 Cans of beer per week    Comment: daily   Drug use: Never   Sexual activity: Yes  Other Topics Concern   Not on file  Social History Narrative   2 step sons---- 1 died October 07, 2010 and they other is estranged   Raised 2 granddaughters      No living will   Wife would be health care POA--granddaughter is alternate   Would accept resuscitation   Not sure about tube feeds--certainly not prolonged   Social Determinants of Health   Financial Resource Strain: Not on file  Food Insecurity: Not on file  Transportation Needs: Not on file  Physical Activity: Not on file  Stress: Not on file   Social Connections: Not on file  Intimate Partner Violence: Not on file   Review of Systems Appetite is okay Weight is stable or up slightly Sleeps okay--just on the couch Wears seat belt Dentures--no dentist No suspicious skin lesions--just stable cysts on back Bowels okay with 2 senna daily Voids okay--just some urgency     Objective:   Physical Exam Constitutional:      Appearance: Normal appearance.  HENT:     Mouth/Throat:     Pharynx: No oropharyngeal exudate or posterior oropharyngeal erythema.  Eyes:     Conjunctiva/sclera: Conjunctivae normal.     Pupils: Pupils are equal, round, and reactive to light.  Cardiovascular:     Rate and Rhythm: Normal rate and regular rhythm.     Pulses: Normal pulses.     Heart sounds: No murmur heard.    No gallop.  Pulmonary:     Effort: Pulmonary effort is normal.     Breath sounds: Normal breath sounds. No wheezing or rales.  Abdominal:     Palpations: Abdomen is soft.     Tenderness: There is no abdominal tenderness.  Musculoskeletal:     Cervical back: Neck supple.     Right lower leg: No edema.     Comments: Trace left ankle edema  Lymphadenopathy:     Cervical: No cervical adenopathy.  Skin:    Findings: No lesion or rash.     Comments: Cysts on back and benign white papules on back of neck  Neurological:     General: No focal deficit present.     Mental Status: He is alert and oriented to person, place, and time.     Comments: Word naming--13/1 minute Recall--- 3/3  Psychiatric:        Mood and Affect: Mood normal.        Behavior: Behavior normal.            Assessment & Plan:

## 2022-09-23 NOTE — Assessment & Plan Note (Signed)
Has cut back some---urged him to lmit as much as possible

## 2022-09-23 NOTE — Progress Notes (Signed)
Hearing Screening - Comments:: Passed whisper test Vision Screening - Comments:: September 2023  

## 2022-09-23 NOTE — Assessment & Plan Note (Signed)
Quiet on the omeprazole '20mg'$  daily

## 2022-09-23 NOTE — Assessment & Plan Note (Signed)
Had RT to prostate and spine On lupron and oral nubeqa

## 2022-10-13 DIAGNOSIS — C61 Malignant neoplasm of prostate: Secondary | ICD-10-CM | POA: Diagnosis not present

## 2022-10-13 DIAGNOSIS — Z923 Personal history of irradiation: Secondary | ICD-10-CM | POA: Diagnosis not present

## 2022-10-13 DIAGNOSIS — R29898 Other symptoms and signs involving the musculoskeletal system: Secondary | ICD-10-CM | POA: Diagnosis not present

## 2022-10-13 DIAGNOSIS — M79605 Pain in left leg: Secondary | ICD-10-CM | POA: Diagnosis not present

## 2022-10-13 DIAGNOSIS — R739 Hyperglycemia, unspecified: Secondary | ICD-10-CM | POA: Diagnosis not present

## 2022-10-13 DIAGNOSIS — M5416 Radiculopathy, lumbar region: Secondary | ICD-10-CM | POA: Diagnosis not present

## 2022-10-13 DIAGNOSIS — G893 Neoplasm related pain (acute) (chronic): Secondary | ICD-10-CM | POA: Diagnosis not present

## 2022-10-13 DIAGNOSIS — I10 Essential (primary) hypertension: Secondary | ICD-10-CM | POA: Diagnosis not present

## 2022-10-13 DIAGNOSIS — M1612 Unilateral primary osteoarthritis, left hip: Secondary | ICD-10-CM | POA: Diagnosis not present

## 2022-11-12 DIAGNOSIS — I10 Essential (primary) hypertension: Secondary | ICD-10-CM | POA: Diagnosis not present

## 2022-11-12 DIAGNOSIS — M5417 Radiculopathy, lumbosacral region: Secondary | ICD-10-CM | POA: Diagnosis not present

## 2022-11-12 DIAGNOSIS — G629 Polyneuropathy, unspecified: Secondary | ICD-10-CM | POA: Diagnosis not present

## 2022-11-12 DIAGNOSIS — R739 Hyperglycemia, unspecified: Secondary | ICD-10-CM | POA: Diagnosis not present

## 2022-11-12 DIAGNOSIS — M79605 Pain in left leg: Secondary | ICD-10-CM | POA: Diagnosis not present

## 2022-11-26 DIAGNOSIS — C7951 Secondary malignant neoplasm of bone: Secondary | ICD-10-CM | POA: Diagnosis not present

## 2022-11-26 DIAGNOSIS — C61 Malignant neoplasm of prostate: Secondary | ICD-10-CM | POA: Diagnosis not present

## 2022-11-30 DIAGNOSIS — M5417 Radiculopathy, lumbosacral region: Secondary | ICD-10-CM | POA: Diagnosis not present

## 2022-11-30 DIAGNOSIS — C7951 Secondary malignant neoplasm of bone: Secondary | ICD-10-CM | POA: Diagnosis not present

## 2022-11-30 DIAGNOSIS — C61 Malignant neoplasm of prostate: Secondary | ICD-10-CM | POA: Diagnosis not present

## 2022-12-07 DIAGNOSIS — M79605 Pain in left leg: Secondary | ICD-10-CM | POA: Diagnosis not present

## 2022-12-07 DIAGNOSIS — R29898 Other symptoms and signs involving the musculoskeletal system: Secondary | ICD-10-CM | POA: Diagnosis not present

## 2022-12-28 IMAGING — US US ABDOMINAL AORTA SCREENING AAA
1 series · 14 of 23 positions shown · non-contrast
Comparison: None.

CLINICAL DATA: Male between 65-75 years of age with a smoking
history.

EXAM:
US ABDOMINAL AORTA MEDICARE SCREENING
TECHNIQUE: Ultrasound examination of the abdominal aorta was performed as a
screening evaluation for abdominal aortic aneurysm.

[Series 1: us aorta medicare screening · 14 of 23 slices shown]
[im 1/23]
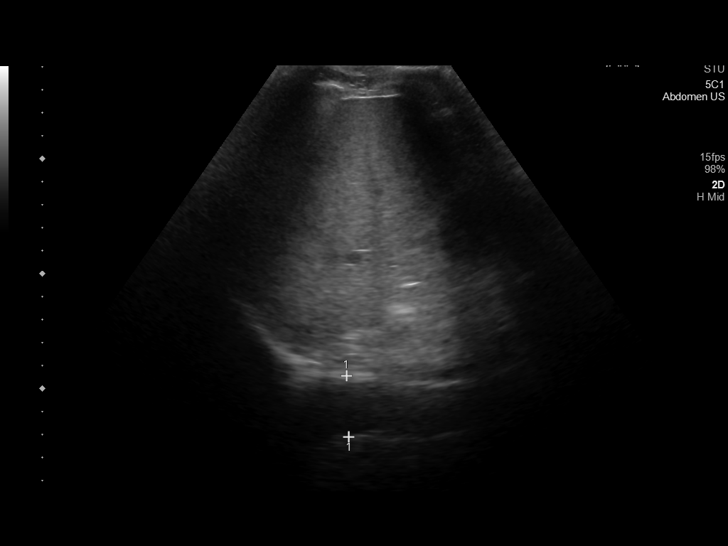
[im 3/23]
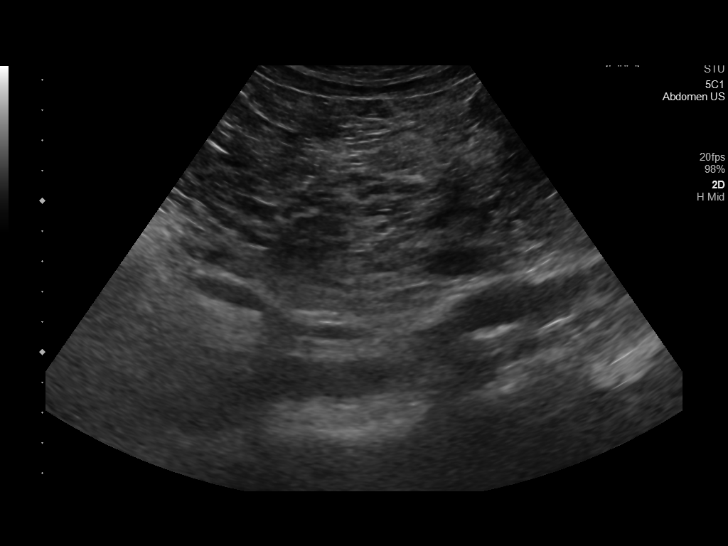
[im 5/23]
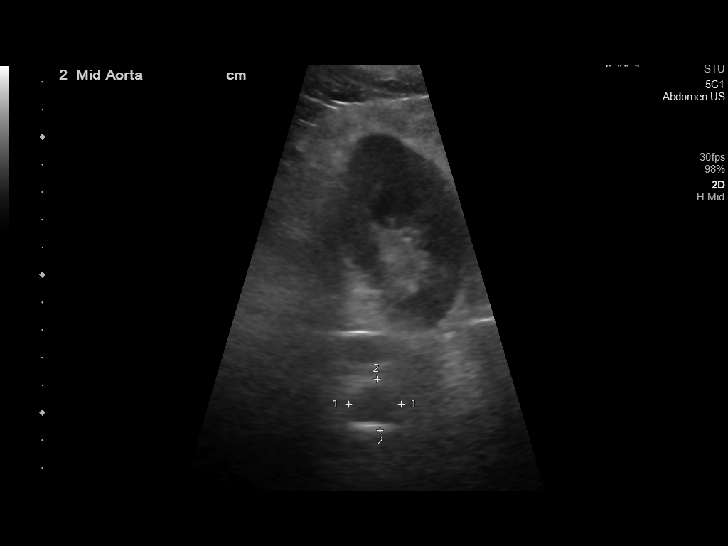
[im 6/23]
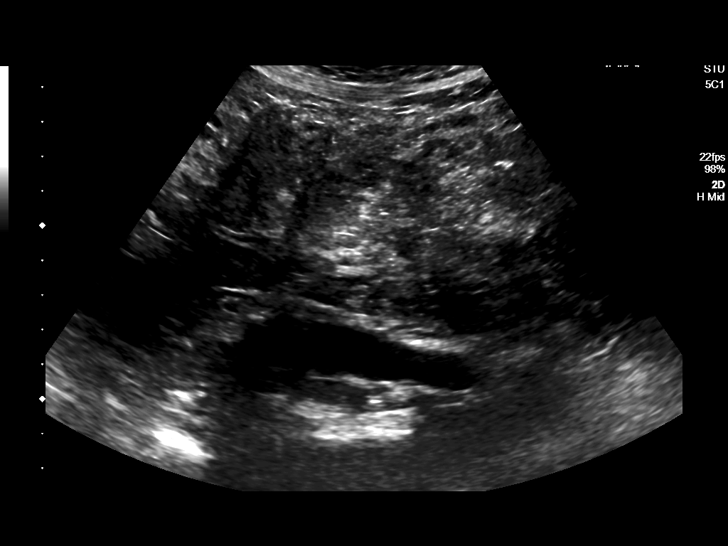
[im 8/23]
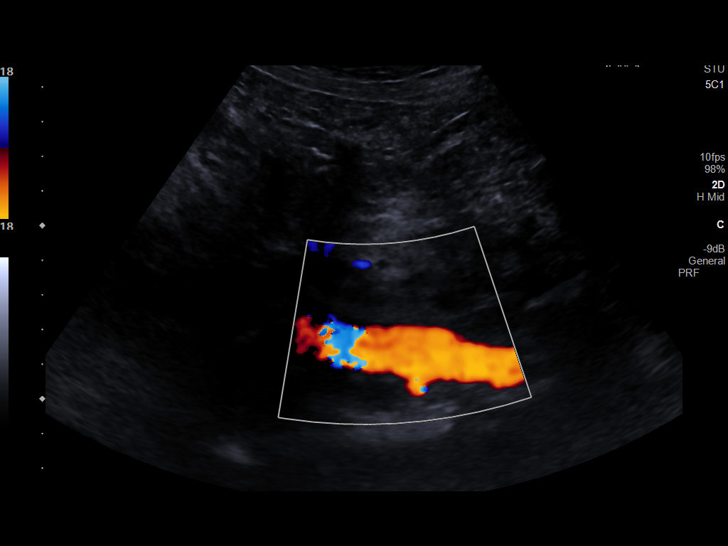
[im 10/23]
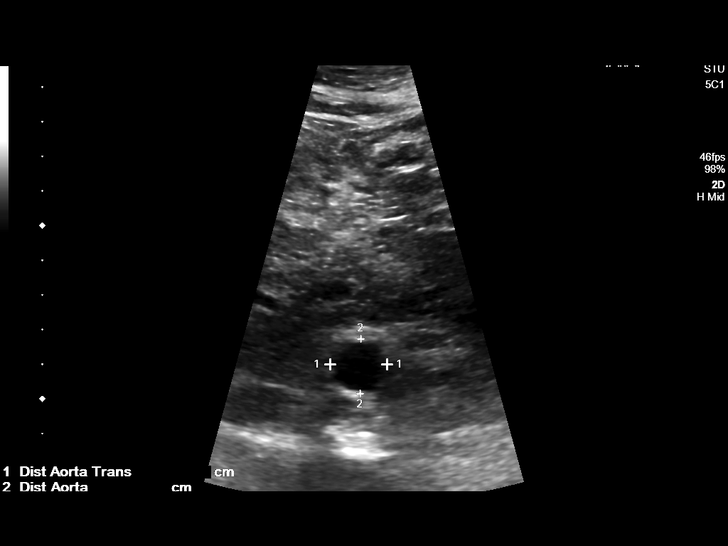
[im 11/23]
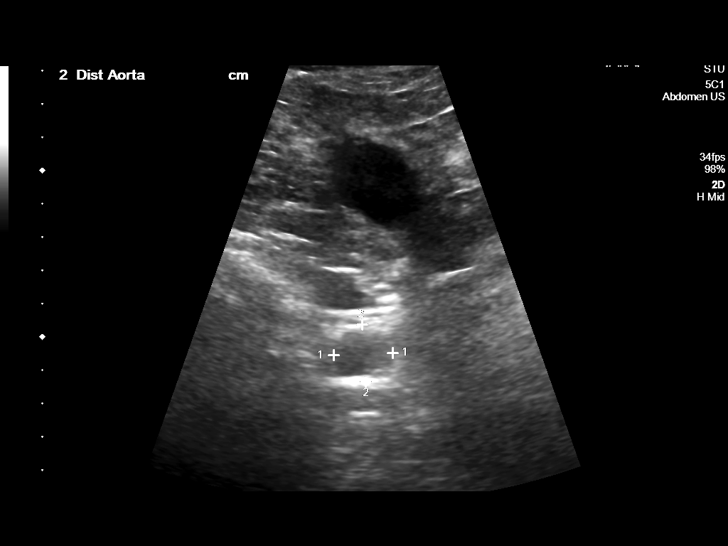
[im 13/23]
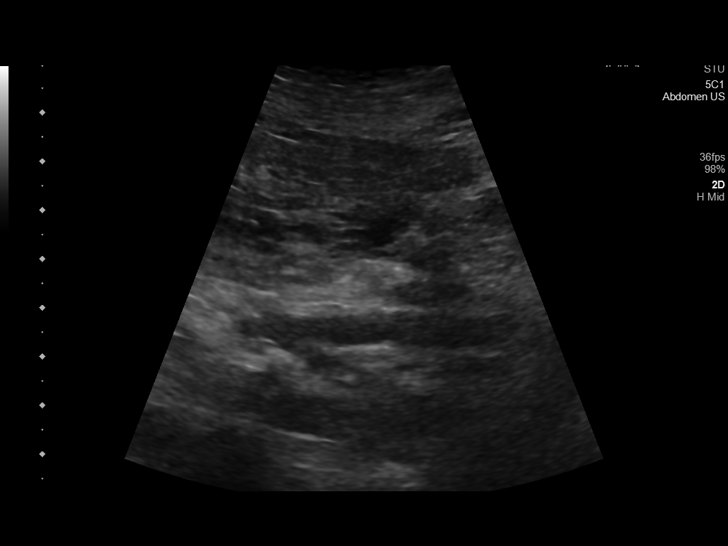
[im 14/23]
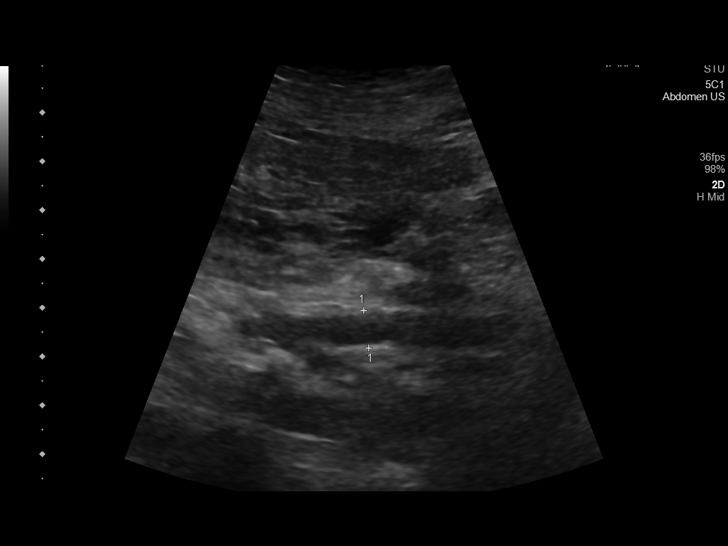
[im 16/23]
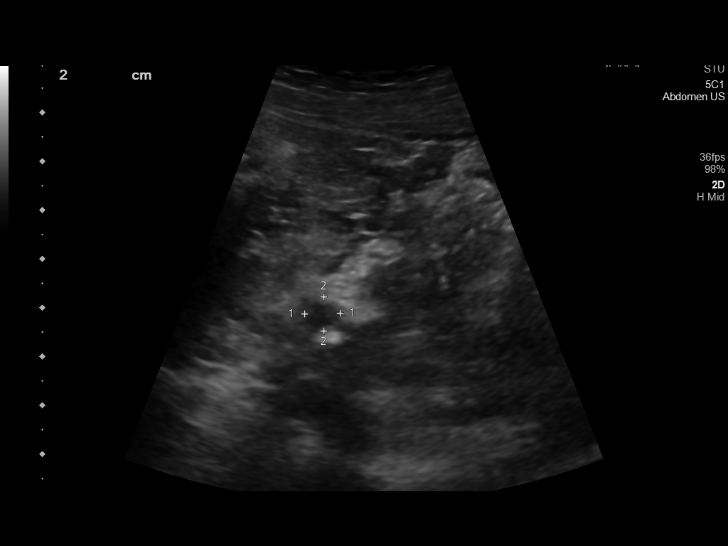
[im 18/23]
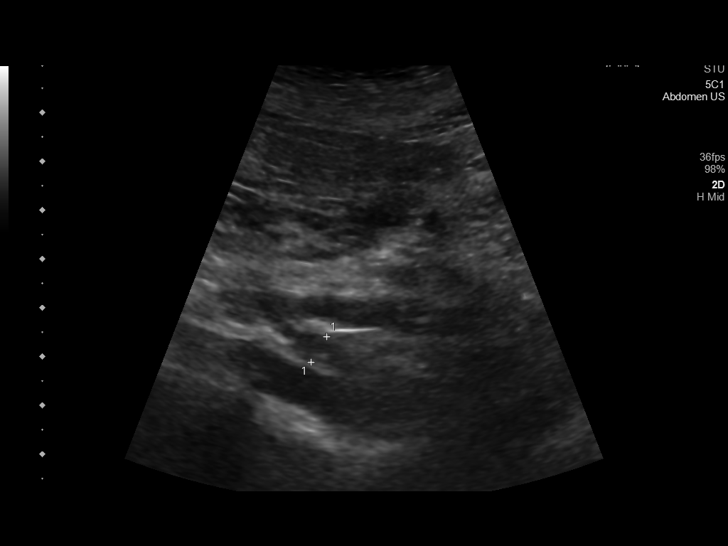
[im 19/23]
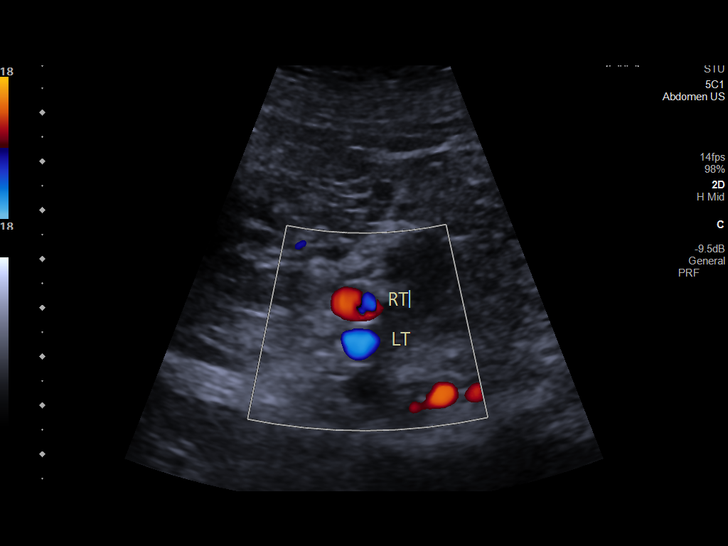
[im 21/23]
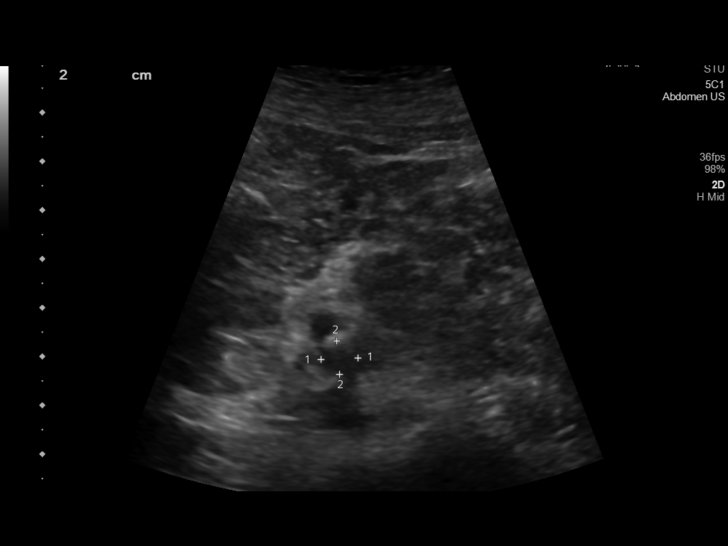
[im 23/23]
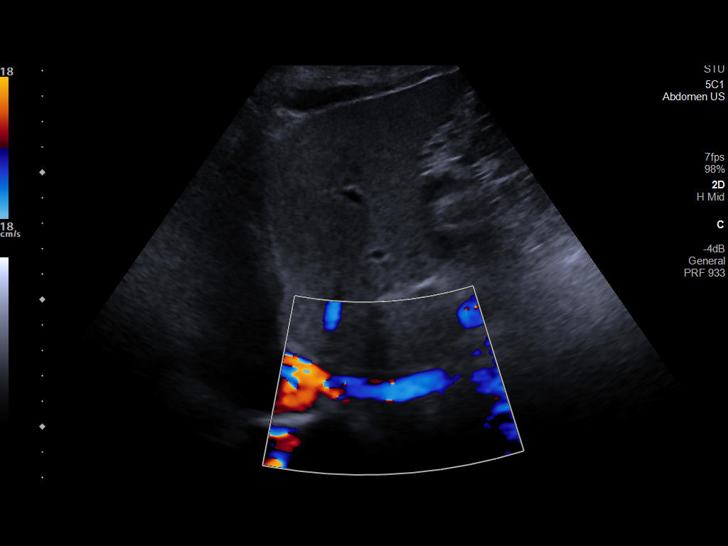

[14 of 23 positions shown; findings below may reference images not displayed]

FINDINGS: Abdominal aortic measurements as follows:

Proximal:  2.6 x 2.7 cm

Mid:  1.8 x 1.9 cm

Distal:  1.6 x 1.7 cm

Visualized proximal common iliac arteries are of normal caliber.
Calcified plaque is present in the abdominal aorta.
IMPRESSION: Atherosclerosis of the abdominal aorta without evidence of
aneurysmal disease.

## 2023-01-04 DIAGNOSIS — Z8546 Personal history of malignant neoplasm of prostate: Secondary | ICD-10-CM | POA: Diagnosis not present

## 2023-01-04 DIAGNOSIS — M47816 Spondylosis without myelopathy or radiculopathy, lumbar region: Secondary | ICD-10-CM | POA: Diagnosis not present

## 2023-01-04 DIAGNOSIS — M79605 Pain in left leg: Secondary | ICD-10-CM | POA: Diagnosis not present

## 2023-01-04 DIAGNOSIS — M48061 Spinal stenosis, lumbar region without neurogenic claudication: Secondary | ICD-10-CM | POA: Diagnosis not present

## 2023-01-04 DIAGNOSIS — C7951 Secondary malignant neoplasm of bone: Secondary | ICD-10-CM | POA: Diagnosis not present

## 2023-01-04 DIAGNOSIS — R29898 Other symptoms and signs involving the musculoskeletal system: Secondary | ICD-10-CM | POA: Diagnosis not present

## 2023-01-20 DIAGNOSIS — C61 Malignant neoplasm of prostate: Secondary | ICD-10-CM | POA: Diagnosis not present

## 2023-01-20 DIAGNOSIS — C7951 Secondary malignant neoplasm of bone: Secondary | ICD-10-CM | POA: Diagnosis not present

## 2023-02-23 DIAGNOSIS — M47812 Spondylosis without myelopathy or radiculopathy, cervical region: Secondary | ICD-10-CM | POA: Diagnosis not present

## 2023-02-23 DIAGNOSIS — C61 Malignant neoplasm of prostate: Secondary | ICD-10-CM | POA: Diagnosis not present

## 2023-02-23 DIAGNOSIS — R200 Anesthesia of skin: Secondary | ICD-10-CM | POA: Diagnosis not present

## 2023-02-23 DIAGNOSIS — C7951 Secondary malignant neoplasm of bone: Secondary | ICD-10-CM | POA: Diagnosis not present

## 2023-02-23 DIAGNOSIS — C7652 Malignant neoplasm of left lower limb: Secondary | ICD-10-CM | POA: Diagnosis not present

## 2023-02-23 DIAGNOSIS — M4802 Spinal stenosis, cervical region: Secondary | ICD-10-CM | POA: Diagnosis not present

## 2023-02-25 DIAGNOSIS — C61 Malignant neoplasm of prostate: Secondary | ICD-10-CM | POA: Diagnosis not present

## 2023-03-01 DIAGNOSIS — K219 Gastro-esophageal reflux disease without esophagitis: Secondary | ICD-10-CM | POA: Diagnosis not present

## 2023-03-01 DIAGNOSIS — Z5111 Encounter for antineoplastic chemotherapy: Secondary | ICD-10-CM | POA: Diagnosis not present

## 2023-03-01 DIAGNOSIS — C7951 Secondary malignant neoplasm of bone: Secondary | ICD-10-CM | POA: Diagnosis not present

## 2023-03-01 DIAGNOSIS — Z79899 Other long term (current) drug therapy: Secondary | ICD-10-CM | POA: Diagnosis not present

## 2023-03-01 DIAGNOSIS — F17210 Nicotine dependence, cigarettes, uncomplicated: Secondary | ICD-10-CM | POA: Diagnosis not present

## 2023-03-01 DIAGNOSIS — C61 Malignant neoplasm of prostate: Secondary | ICD-10-CM | POA: Diagnosis not present

## 2023-03-09 DIAGNOSIS — M79601 Pain in right arm: Secondary | ICD-10-CM | POA: Diagnosis not present

## 2023-03-09 DIAGNOSIS — R29898 Other symptoms and signs involving the musculoskeletal system: Secondary | ICD-10-CM | POA: Diagnosis not present

## 2023-04-05 DIAGNOSIS — R29898 Other symptoms and signs involving the musculoskeletal system: Secondary | ICD-10-CM | POA: Diagnosis not present

## 2023-04-22 DIAGNOSIS — G5601 Carpal tunnel syndrome, right upper limb: Secondary | ICD-10-CM | POA: Diagnosis not present

## 2023-05-20 DIAGNOSIS — C61 Malignant neoplasm of prostate: Secondary | ICD-10-CM | POA: Diagnosis not present

## 2023-05-20 DIAGNOSIS — C7951 Secondary malignant neoplasm of bone: Secondary | ICD-10-CM | POA: Diagnosis not present

## 2023-05-31 DIAGNOSIS — C61 Malignant neoplasm of prostate: Secondary | ICD-10-CM | POA: Diagnosis not present

## 2023-05-31 DIAGNOSIS — C7951 Secondary malignant neoplasm of bone: Secondary | ICD-10-CM | POA: Diagnosis not present

## 2023-06-04 DIAGNOSIS — G5601 Carpal tunnel syndrome, right upper limb: Secondary | ICD-10-CM | POA: Diagnosis not present

## 2023-08-10 DIAGNOSIS — G5601 Carpal tunnel syndrome, right upper limb: Secondary | ICD-10-CM | POA: Diagnosis not present

## 2023-08-31 DIAGNOSIS — C7951 Secondary malignant neoplasm of bone: Secondary | ICD-10-CM | POA: Diagnosis not present

## 2023-08-31 DIAGNOSIS — C61 Malignant neoplasm of prostate: Secondary | ICD-10-CM | POA: Diagnosis not present

## 2023-09-06 DIAGNOSIS — Z923 Personal history of irradiation: Secondary | ICD-10-CM | POA: Diagnosis not present

## 2023-09-06 DIAGNOSIS — Z7409 Other reduced mobility: Secondary | ICD-10-CM | POA: Diagnosis not present

## 2023-09-06 DIAGNOSIS — G629 Polyneuropathy, unspecified: Secondary | ICD-10-CM | POA: Diagnosis not present

## 2023-09-06 DIAGNOSIS — F1020 Alcohol dependence, uncomplicated: Secondary | ICD-10-CM | POA: Diagnosis not present

## 2023-09-06 DIAGNOSIS — M1612 Unilateral primary osteoarthritis, left hip: Secondary | ICD-10-CM | POA: Diagnosis not present

## 2023-09-06 DIAGNOSIS — C7951 Secondary malignant neoplasm of bone: Secondary | ICD-10-CM | POA: Diagnosis not present

## 2023-09-06 DIAGNOSIS — C7989 Secondary malignant neoplasm of other specified sites: Secondary | ICD-10-CM | POA: Diagnosis not present

## 2023-09-06 DIAGNOSIS — Z887 Allergy status to serum and vaccine status: Secondary | ICD-10-CM | POA: Diagnosis not present

## 2023-09-06 DIAGNOSIS — R296 Repeated falls: Secondary | ICD-10-CM | POA: Diagnosis not present

## 2023-09-06 DIAGNOSIS — G5601 Carpal tunnel syndrome, right upper limb: Secondary | ICD-10-CM | POA: Diagnosis not present

## 2023-09-06 DIAGNOSIS — Z79899 Other long term (current) drug therapy: Secondary | ICD-10-CM | POA: Diagnosis not present

## 2023-09-06 DIAGNOSIS — Z8042 Family history of malignant neoplasm of prostate: Secondary | ICD-10-CM | POA: Diagnosis not present

## 2023-09-06 DIAGNOSIS — F17210 Nicotine dependence, cigarettes, uncomplicated: Secondary | ICD-10-CM | POA: Diagnosis not present

## 2023-09-06 DIAGNOSIS — E538 Deficiency of other specified B group vitamins: Secondary | ICD-10-CM | POA: Diagnosis not present

## 2023-09-06 DIAGNOSIS — H57812 Brow ptosis, left: Secondary | ICD-10-CM | POA: Diagnosis not present

## 2023-09-06 DIAGNOSIS — R29898 Other symptoms and signs involving the musculoskeletal system: Secondary | ICD-10-CM | POA: Diagnosis not present

## 2023-09-06 DIAGNOSIS — M5432 Sciatica, left side: Secondary | ICD-10-CM | POA: Diagnosis not present

## 2023-09-06 DIAGNOSIS — C61 Malignant neoplasm of prostate: Secondary | ICD-10-CM | POA: Diagnosis not present

## 2023-09-13 DIAGNOSIS — H52223 Regular astigmatism, bilateral: Secondary | ICD-10-CM | POA: Diagnosis not present

## 2023-09-13 DIAGNOSIS — H524 Presbyopia: Secondary | ICD-10-CM | POA: Diagnosis not present

## 2023-09-28 ENCOUNTER — Encounter: Payer: Self-pay | Admitting: Internal Medicine

## 2023-09-28 ENCOUNTER — Ambulatory Visit: Payer: Medicare HMO | Admitting: Internal Medicine

## 2023-09-28 VITALS — BP 138/80 | HR 78 | Temp 97.9°F | Ht 70.5 in | Wt 223.0 lb

## 2023-09-28 DIAGNOSIS — Z1211 Encounter for screening for malignant neoplasm of colon: Secondary | ICD-10-CM

## 2023-09-28 DIAGNOSIS — M792 Neuralgia and neuritis, unspecified: Secondary | ICD-10-CM | POA: Diagnosis not present

## 2023-09-28 DIAGNOSIS — C7951 Secondary malignant neoplasm of bone: Secondary | ICD-10-CM | POA: Diagnosis not present

## 2023-09-28 DIAGNOSIS — Z0000 Encounter for general adult medical examination without abnormal findings: Secondary | ICD-10-CM

## 2023-09-28 DIAGNOSIS — C61 Malignant neoplasm of prostate: Secondary | ICD-10-CM | POA: Diagnosis not present

## 2023-09-28 DIAGNOSIS — I471 Supraventricular tachycardia, unspecified: Secondary | ICD-10-CM | POA: Diagnosis not present

## 2023-09-28 NOTE — Progress Notes (Signed)
 Hearing Screening - Comments:: Passed whisper test Vision Screening - Comments:: July 2025

## 2023-09-28 NOTE — Progress Notes (Signed)
 Subjective:    Patient ID: Miguel Christian, male    DOB: 11/07/1955, 68 y.o.   MRN: [id number]  HPI Here for Medicare wellness visit and follow up of chronic health conditions With wife Reviewed advanced directives Reviewed other doctors--Dr Miller--oncology, Dr Gao--neurology, Dr Pearlstein--radiation oncology, Dr Skip, Dr Mazzela--cardiology, Dr Rhunette No hospitalizations or surgery this year---is having carpal tunnel repair on right next week Not able to walk or exercise much due to neuropathy and pain Unstable but no falls this year. Discussed walking stick or cane Still drinks beer Vision is okay---glasses prescribed at last visit Hearing is okay No depression or anhedonia Independent with instrumental ADLs--still working No sig memory issues  Still on nubeqa for the prostate cancer Lupron  every 6 months or so Last PSA almost 0  Having worsening groin neuropathy pains They are recommending spine injection---?from past RT Does ride lawn mower----and then gets off and has terrible hip burning High dose gabapentin 900/600/900---neurologist had him try more even  No palpitations No chest pain or SOB No dizziness or syncope Mild puffiness in ankles  Current Outpatient Medications on File Prior to Visit  Medication Sig Dispense Refill   cyanocobalamin  (VITAMIN B12) 1000 MCG tablet Take 1,000 mcg by mouth every other day.     darolutamide (NUBEQA) 300 MG tablet Take by mouth.     gabapentin (NEURONTIN) 300 MG capsule Take 900 mg by mouth 3 (three) times daily.     Multiple Vitamins-Minerals (MULTIVITAMIN ADULT EXTRA C PO) Take by mouth.     omeprazole  (PRILOSEC) 20 MG capsule TAKE 1 CAPSULE(20 MG) BY MOUTH DAILY 30 capsule 11   verapamil (CALAN-SR) 180 MG CR tablet Take 180 mg by mouth daily.     No current facility-administered medications on file prior to visit.    No Known Allergies  Past Medical History:  Diagnosis Date   Alcohol use disorder,  moderate, dependence (HCC)    Prostate cancer [facility])     Past Surgical History:  Procedure Laterality Date   COLONOSCOPY WITH PROPOFOL  N/A 09/24/2020   Procedure: COLONOSCOPY WITH PROPOFOL ;  Surgeon: Jinny Carmine, MD;  Location: [facility] ENDOSCOPY;  Service: Endoscopy;  Laterality: N/A;   HEMORRHOID SURGERY  ~2000    Family History  Problem Relation Age of Onset   Alcohol abuse Father    Alcohol abuse Brother    Atrial fibrillation Brother    Colon cancer Maternal Uncle        2 of them   Hearing loss Maternal Uncle        2 of uncles   Diabetes Brother    Hypertension Brother     Social History   Socioeconomic History   Marital status: Married    Spouse name: Not on file   Number of children: Not on file   Years of education: Not on file   Highest education level: 12th grade  Occupational History   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  Tobacco Use   Smoking status: Former    Current packs/day: 0.00    Types: Cigarettes    Quit date: 03/19/2020    Years since quitting: 3.5   Smokeless tobacco: Never   Tobacco comments:    Using the nicotine patch  Substance and Sexual Activity   Alcohol use: Yes    Alcohol/week: 7.0 standard drinks of alcohol    Types: 7 Cans of beer per week    Comment: daily  Drug use: Never   Sexual activity: Yes  Other Topics Concern   Not on file  Social History Narrative   2 step sons---- 1 died October 18, 2010 and they other is estranged   Raised 2 granddaughters      No living will   Wife would be health care POA--granddaughter Gracelyn is alternate   Would accept resuscitation   Not sure about tube feeds--certainly not prolonged   Social Drivers of Health   Financial Resource Strain: Medium Risk (09/27/2023)   Overall Financial Resource Strain (CARDIA)    Difficulty of Paying Living Expenses: Somewhat hard  Food Insecurity: Patient Declined (09/27/2023)   Hunger Vital Sign    Worried About  Running Out of Food in the Last Year: Patient declined    Ran Out of Food in the Last Year: Patient declined  Transportation Needs: No Transportation Needs (09/27/2023)   PRAPARE - Administrator, Civil Service (Medical): No    Lack of Transportation (Non-Medical): No  Physical Activity: Unknown (09/27/2023)   Exercise Vital Sign    Days of Exercise per Week: 7 days    Minutes of Exercise per Session: Patient declined  Stress: No Stress Concern Present (09/27/2023)   [employer] of Occupational Health - Occupational Stress Questionnaire    Feeling of Stress: Not at all  Social Connections: Moderately Isolated (09/27/2023)   Social Connection and Isolation Panel    Frequency of Communication with Friends and Family: More than three times a week    Frequency of Social Gatherings with Friends and Family: Three times a week    Attends Religious Services: Patient declined    Active Member of Clubs or Organizations: No    Attends Engineer, structural: Not on file    Marital Status: Married  Catering manager Violence: Not on file   Review of Systems Appetite is okay Weight stable Sleeps okay with the gabapentin Nocturia every 2 hours---flow is okay though Wears seat belt Full dentures No heartburn or dysphagia Bowels move fine.  No sig joint issues---just the neuropathy No suspicious skin lesions    Objective:   Physical Exam Constitutional:      Appearance: Normal appearance.  HENT:     Mouth/Throat:     Pharynx: No oropharyngeal exudate or posterior oropharyngeal erythema.  Eyes:     Conjunctiva/sclera: Conjunctivae normal.     Pupils: Pupils are equal, round, and reactive to light.  Cardiovascular:     Rate and Rhythm: Normal rate and regular rhythm.     Pulses: Normal pulses.     Heart sounds: No murmur heard.    No gallop.  Pulmonary:     Effort: Pulmonary effort is normal.     Breath sounds: Normal breath sounds. No wheezing or rales.   Abdominal:     Palpations: Abdomen is soft.     Tenderness: There is no abdominal tenderness.  Musculoskeletal:     Cervical back: Neck supple.     Right lower leg: No edema.     Left lower leg: No edema.  Lymphadenopathy:     Cervical: No cervical adenopathy.  Skin:    Findings: No lesion or rash.  Neurological:     General: No focal deficit present.     Mental Status: He is alert and oriented to person, place, and time.     Comments: Mini-cog---normal  Psychiatric:        Mood and Affect: Mood normal.        Behavior:  Behavior normal.            Assessment & Plan:

## 2023-09-28 NOTE — Assessment & Plan Note (Signed)
 PSA still close to 0 on nubeqa and lupron 

## 2023-09-28 NOTE — Assessment & Plan Note (Signed)
 No symptoms of recurrence on the verapamil 180

## 2023-09-28 NOTE — Assessment & Plan Note (Signed)
 High dose gabapentin Now looking into [facility]

## 2023-09-28 NOTE — Assessment & Plan Note (Signed)
 I have personally reviewed the Medicare Annual Wellness questionnaire and have noted 1. The patient's medical and social history 2. Their use of alcohol, tobacco or illicit drugs 3. Their current medications and supplements 4. The patient's functional ability including ADL's, fall risks, home safety risks and hearing or visual             impairment. 5. Diet and physical activities 6. Evidence for depression or mood disorders  The patients weight, height, BMI and visual acuity have been recorded in the chart I have made referrals, counseling and provided education to the patient based review of the above and I have provided the pt with a written personalized care plan for preventive services.  I have provided you with a copy of your personalized plan for preventive services. Please take the time to review along with your updated medication list.  Due for colonoscopy--needs new referral Tries to stay active Still prefers no immunizations---urged him to get Td at pharmacy

## 2023-10-04 ENCOUNTER — Encounter: Payer: Self-pay | Admitting: Internal Medicine

## 2023-10-04 DIAGNOSIS — I471 Supraventricular tachycardia, unspecified: Secondary | ICD-10-CM | POA: Diagnosis not present

## 2023-10-04 DIAGNOSIS — G5601 Carpal tunnel syndrome, right upper limb: Secondary | ICD-10-CM | POA: Diagnosis not present

## 2023-10-04 DIAGNOSIS — F17200 Nicotine dependence, unspecified, uncomplicated: Secondary | ICD-10-CM | POA: Diagnosis not present

## 2023-10-04 DIAGNOSIS — M65331 Trigger finger, right middle finger: Secondary | ICD-10-CM | POA: Diagnosis not present

## 2023-10-04 HISTORY — PX: CARPAL TUNNEL RELEASE: SHX101

## 2023-10-14 ENCOUNTER — Encounter: Payer: Self-pay | Admitting: *Deleted

## 2023-10-27 DIAGNOSIS — R29898 Other symptoms and signs involving the musculoskeletal system: Secondary | ICD-10-CM | POA: Diagnosis not present

## 2023-10-27 DIAGNOSIS — M47816 Spondylosis without myelopathy or radiculopathy, lumbar region: Secondary | ICD-10-CM | POA: Diagnosis not present

## 2023-10-27 DIAGNOSIS — M5416 Radiculopathy, lumbar region: Secondary | ICD-10-CM | POA: Diagnosis not present

## 2023-12-16 DIAGNOSIS — M5416 Radiculopathy, lumbar region: Secondary | ICD-10-CM | POA: Diagnosis not present

## 2023-12-27 DIAGNOSIS — C7951 Secondary malignant neoplasm of bone: Secondary | ICD-10-CM | POA: Diagnosis not present

## 2023-12-27 DIAGNOSIS — C61 Malignant neoplasm of prostate: Secondary | ICD-10-CM | POA: Diagnosis not present

## 2024-01-19 ENCOUNTER — Ambulatory Visit (INDEPENDENT_AMBULATORY_CARE_PROVIDER_SITE_OTHER)

## 2024-01-19 VITALS — BP 114/60 | HR 85 | Temp 97.9°F | Ht 70.5 in | Wt 231.0 lb

## 2024-01-19 DIAGNOSIS — Z131 Encounter for screening for diabetes mellitus: Secondary | ICD-10-CM

## 2024-01-19 DIAGNOSIS — Z136 Encounter for screening for cardiovascular disorders: Secondary | ICD-10-CM

## 2024-01-19 DIAGNOSIS — C7951 Secondary malignant neoplasm of bone: Secondary | ICD-10-CM | POA: Diagnosis not present

## 2024-01-19 DIAGNOSIS — F1020 Alcohol dependence, uncomplicated: Secondary | ICD-10-CM

## 2024-01-19 DIAGNOSIS — C61 Malignant neoplasm of prostate: Secondary | ICD-10-CM

## 2024-01-19 NOTE — Patient Instructions (Addendum)
 Thank you for visiting [facility] today! Here's what we talked about: - Call [facility] Physical medicine to set up appointment - Call [facility] cardiology to set up appointment - Use Omeprazole  only as needed - Come back for fasting labs in 1-2 weeks - They will call you for bone density scan

## 2024-01-19 NOTE — Progress Notes (Signed)
 Subjective:   This visit was conducted in person. The patient gave informed consent to the use of Abridge AI technology to record the contents of the encounter as documented below.   Patient ID: Miguel Christian, male    DOB: 03-Apr-1955, 68 y.o.   MRN: [id number]   Discussed the use of AI scribe software for clinical note transcription with the patient, who gave verbal consent to proceed.  History of Present Illness Miguel Christian is a 68 year old male with prostate cancer who presents for medication review and follow-up.  He has a history of prostate cancer and is under the care of [facility]. He receives hormonal treatments and a hormone injection every six months. His PSA level was last recorded at 0.04. He takes Nubeqa, two tablets twice daily, as part of his treatment regimen.  He experiences nerve pain radiating down his left leg, managed with steroid injections and an epidural four weeks ago, though the latter was ineffective. He has persistent pain in the groin area and burning in the hips and buttocks, especially when walking or carrying weight. He takes gabapentin, three tablets in the morning, two at noon, and three at night, totaling eight tablets daily. Previous treatments included opioids, which were not effective.  He has a history of acid reflux and takes omeprazole  20 mg daily. He has a history of supraventricular tachycardia (SVT) and takes verapamil 180 mg daily.  He takes a multivitamin and vitamin B12 supplement every other day due to previously high levels. He uses Advil daily for pain management.  He has a significant smoking history, currently smoking more than a pack a day, and has been smoking for over thirty years. He also consumes approximately seven beers daily, primarily in the evenings after work.   Review of Systems  All other systems reviewed and are negative.       No Known Allergies  Current Outpatient Medications on File Prior to Visit   Medication Sig Dispense Refill   cyanocobalamin  (VITAMIN B12) 1000 MCG tablet Take 1,000 mcg by mouth every other day.     darolutamide (NUBEQA) 300 MG tablet Take 600 mg by mouth 2 (two) times daily with a meal.     gabapentin (NEURONTIN) 300 MG capsule Take 900 mg by mouth 3 (three) times daily.     ibuprofen (ADVIL) 200 MG tablet Take 600 mg by mouth daily.     Multiple Vitamins-Minerals (MULTIVITAMIN ADULT EXTRA C PO) Take by mouth.     omeprazole  (PRILOSEC) 20 MG capsule TAKE 1 CAPSULE(20 MG) BY MOUTH DAILY 30 capsule 11   verapamil (CALAN-SR) 180 MG CR tablet Take 180 mg by mouth daily.     No current facility-administered medications on file prior to visit.    BP 114/60 (BP Location: Left Arm, Patient Position: Sitting, Cuff Size: Large)   Pulse 85   Temp 97.9 F (36.6 C) (Oral)   Ht 5' 10.5 (1.791 m)   Wt 231 lb (104.8 kg)   SpO2 99%   BMI 32.68 kg/m   Objective:      Physical Exam GENERAL: Alert, cooperative, well developed, no acute distress. HEAD: Normocephalic atraumatic. EYES: Extraocular movements intact BL, pupils round, equal and reactive to light BL, conjunctivae normal BL. EXTREMITIES: No cyanosis or edema. NEUROLOGICAL: Oriented to person, place and time, no gait abnormalities, moves all extremities without gross motor or sensory deficit.      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 PSA undetectable at 0.04. Previous radiation to spine and prostate. Follows with [facility] oncology, he is dues for dexa, will order.  - Continue Nubeqa 600 mg twice daily. - Ordered bone scan for December 2025. - Scheduled follow-up with oncologist in February 2026.  Alcohol use disorder, moderate, dependence Chronic alcohol use, 7 beers daily. Discussed impact on liver, nerves, and B12. Not ready to reduce intake.  - Ordered folic acid  level and anemia panel. -  Discussed potential need for folic acid  supplementation. - Continue B12 supplement 1000mg  every other day  Supraventricular tachycardia Managed with verapamil 120 mg daily. Last cardiology follow-up in 2023, plan at that time was for 1y f/u, counseled pt to schedule appt.  - Continue verapamil 120 mg daily. - Instructed to call [facility] to schedule annual follow-up for monitoring.  Gastroesophageal reflux disease Discussed long-term omeprazole  effects on bone density.  - Discontinued daily omeprazole  and use as needed for heartburn.  General Health Maintenance Routine health maintenance discussed. Fasting labs needed before next physical. - Scheduled fasting labs in 1-2 weeks, including cholesterol and diabetes screening. - Scheduled follow-up physical examination in August 2026.   Return in about 36 weeks (around 09/27/2024) for CPE, fasting labs in 2 weeks. .   Amante Fomby K Vertie Dibbern, MD  01/19/24     Contains text generated by Abridge.

## 2024-01-24 ENCOUNTER — Other Ambulatory Visit

## 2024-01-24 DIAGNOSIS — Z136 Encounter for screening for cardiovascular disorders: Secondary | ICD-10-CM

## 2024-01-24 DIAGNOSIS — Z131 Encounter for screening for diabetes mellitus: Secondary | ICD-10-CM | POA: Diagnosis not present

## 2024-01-24 DIAGNOSIS — F1020 Alcohol dependence, uncomplicated: Secondary | ICD-10-CM | POA: Diagnosis not present

## 2024-01-24 LAB — CBC WITH DIFFERENTIAL/PLATELET
Basophils Absolute: 0 K/uL (ref 0.0–0.1)
Basophils Relative: 1 % (ref 0.0–3.0)
Eosinophils Absolute: 0.2 K/uL (ref 0.0–0.7)
Eosinophils Relative: 3.4 % (ref 0.0–5.0)
HCT: 33.3 % — ABNORMAL LOW (ref 39.0–52.0)
Hemoglobin: 11.6 g/dL — ABNORMAL LOW (ref 13.0–17.0)
Lymphocytes Relative: 23.1 % (ref 12.0–46.0)
Lymphs Abs: 1 K/uL (ref 0.7–4.0)
MCHC: 34.8 g/dL (ref 30.0–36.0)
MCV: 98.5 fl (ref 78.0–100.0)
Monocytes Absolute: 0.5 K/uL (ref 0.1–1.0)
Monocytes Relative: 10.6 % (ref 3.0–12.0)
Neutro Abs: 2.8 K/uL (ref 1.4–7.7)
Neutrophils Relative %: 61.9 % (ref 43.0–77.0)
Platelets: 195 K/uL (ref 150.0–400.0)
RBC: 3.38 Mil/uL — ABNORMAL LOW (ref 4.22–5.81)
RDW: 13 % (ref 11.5–15.5)
WBC: 4.5 K/uL (ref 4.0–10.5)

## 2024-01-24 LAB — HEMOGLOBIN A1C: Hgb A1c MFr Bld: 5.5 % (ref 4.6–6.5)

## 2024-01-24 LAB — LIPID PANEL
Cholesterol: 176 mg/dL (ref 0–200)
HDL: 60.3 mg/dL (ref >39.00)
LDL Cholesterol: 80 mg/dL (ref 0–99)
NonHDL: 115.73
Total CHOL/HDL Ratio: 3
Triglycerides: 177 mg/dL — ABNORMAL HIGH (ref 0.0–149.0)
VLDL: 35.4 mg/dL (ref 0.0–40.0)

## 2024-01-24 LAB — FOLATE: Folate: 17.5 ng/mL (ref >5.9)

## 2024-01-24 LAB — VITAMIN B12: Vitamin B-12: 932 pg/mL — ABNORMAL HIGH (ref 211–911)

## 2024-01-26 ENCOUNTER — Ambulatory Visit: Payer: Self-pay

## 2024-09-27 ENCOUNTER — Encounter

## 2024-09-28 ENCOUNTER — Encounter
